# Patient Record
Sex: Female | Born: 1940 | Race: White | Hispanic: No | Marital: Married | Smoking: Never smoker
Health system: Southern US, Community
[De-identification: ages and names within clinical notes are randomized; demographics above are authoritative.]

## PROBLEM LIST (undated history)

## (undated) DIAGNOSIS — R609 Edema, unspecified: Secondary | ICD-10-CM

## (undated) DIAGNOSIS — I480 Paroxysmal atrial fibrillation: Secondary | ICD-10-CM

## (undated) DIAGNOSIS — G4730 Sleep apnea, unspecified: Secondary | ICD-10-CM

## (undated) DIAGNOSIS — I10 Essential (primary) hypertension: Secondary | ICD-10-CM

## (undated) DIAGNOSIS — E559 Vitamin D deficiency, unspecified: Secondary | ICD-10-CM

## (undated) DIAGNOSIS — E785 Hyperlipidemia, unspecified: Secondary | ICD-10-CM

## (undated) DIAGNOSIS — E782 Mixed hyperlipidemia: Secondary | ICD-10-CM

## (undated) DIAGNOSIS — I509 Heart failure, unspecified: Secondary | ICD-10-CM

## (undated) DIAGNOSIS — K219 Gastro-esophageal reflux disease without esophagitis: Secondary | ICD-10-CM

## (undated) HISTORY — PX: APPENDECTOMY: SHX54

## (undated) HISTORY — PX: STOMACH SURGERY: SHX791

## (undated) HISTORY — PX: TUBAL LIGATION: SHX77

---
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
## 2002-12-11 ENCOUNTER — Encounter: Payer: Self-pay | Admitting: Surgery

## 2002-12-16 ENCOUNTER — Inpatient Hospital Stay (HOSPITAL_COMMUNITY): Admission: RE | Admit: 2002-12-16 | Discharge: 2002-12-18 | Payer: Self-pay | Admitting: Surgery

## 2002-12-16 ENCOUNTER — Encounter: Payer: Self-pay | Admitting: Surgery

## 2002-12-17 ENCOUNTER — Encounter: Payer: Self-pay | Admitting: Surgery

## 2007-08-07 ENCOUNTER — Encounter: Admission: RE | Admit: 2007-08-07 | Discharge: 2007-08-07 | Payer: Self-pay | Admitting: Surgery

## 2007-10-12 ENCOUNTER — Inpatient Hospital Stay (HOSPITAL_COMMUNITY): Admission: RE | Admit: 2007-10-12 | Discharge: 2007-10-14 | Payer: Self-pay | Admitting: Surgery

## 2007-11-12 ENCOUNTER — Encounter: Admission: RE | Admit: 2007-11-12 | Discharge: 2007-11-12 | Payer: Self-pay | Admitting: *Deleted

## 2008-02-14 IMAGING — CR DG UGI W/ HIGH DENSITY W/KUB
19 of 24 series · 19 of 24 positions shown · non-contrast
Comparison: GI [REDACTED] upper GI series 08/07/07.

CLINICAL DATA: Choking/dysphagia, intermittent nausea, epigastric pain while eating.  Nissen fundoplication for hiatus hernia 10/11/07. 
 UPPER GI WITH HIGH DENSITY WITH KUB:
TECHNIQUE: Double and single contrast barium with ingested 13mm barium tablet with water.

[run (1 of 19)]
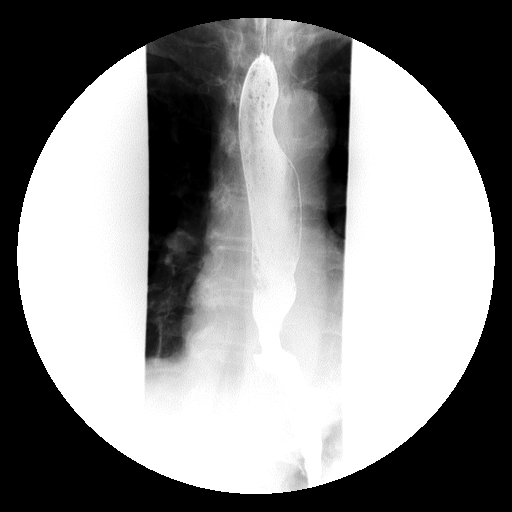

[run (2 of 19)]
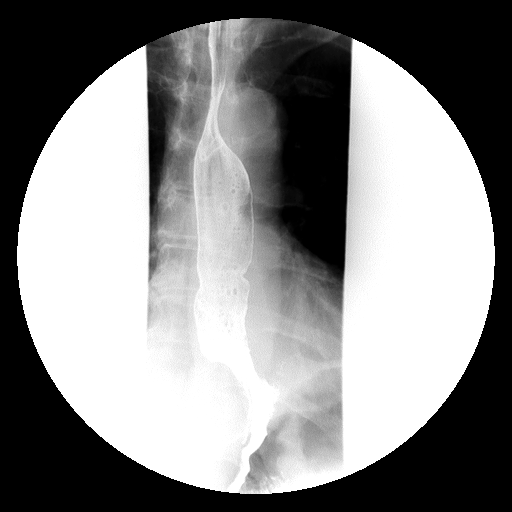

[run (3 of 19)]
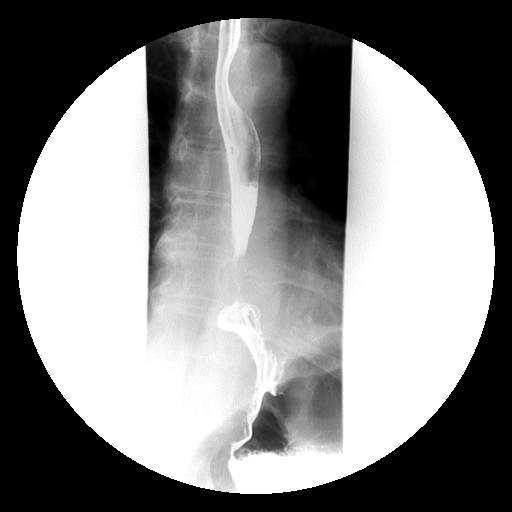

[run (4 of 19)]
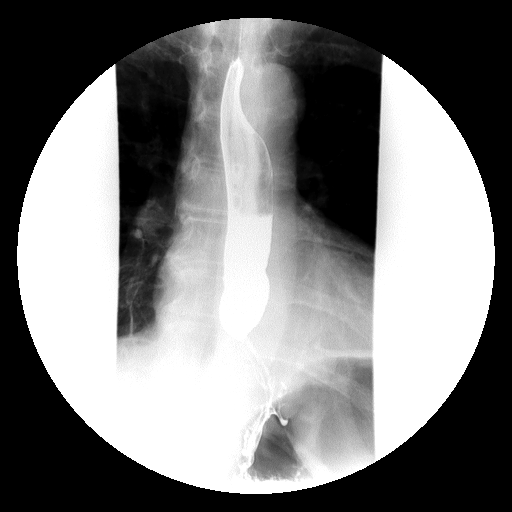

[run (5 of 19)]
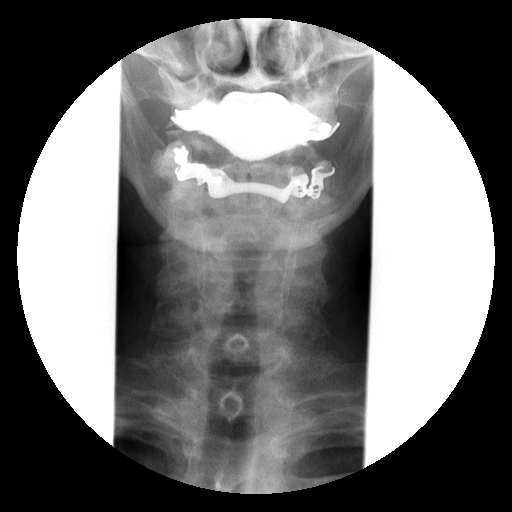

[run (6 of 19)]
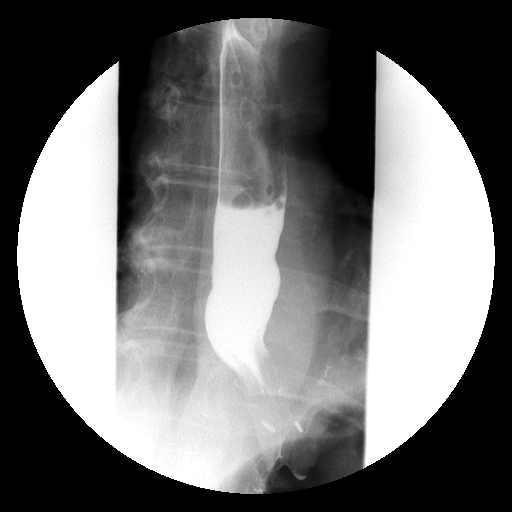

[run (7 of 19)]
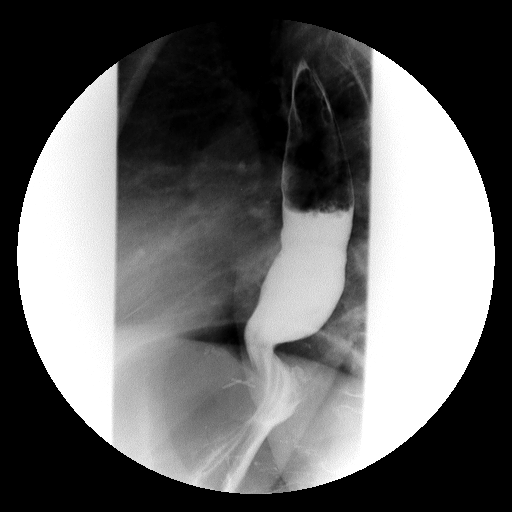

[run (8 of 19)]
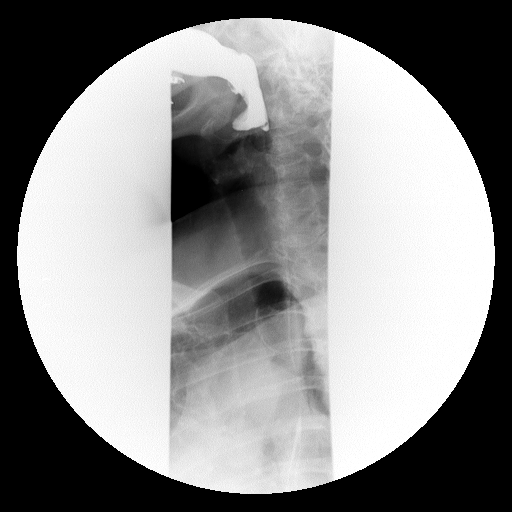

[run (9 of 19)]
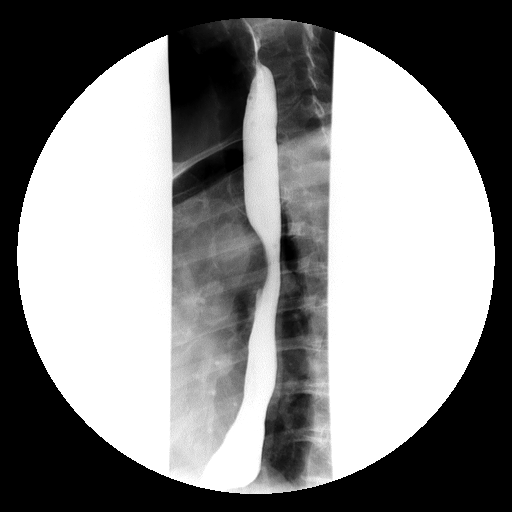

[run (10 of 19)]
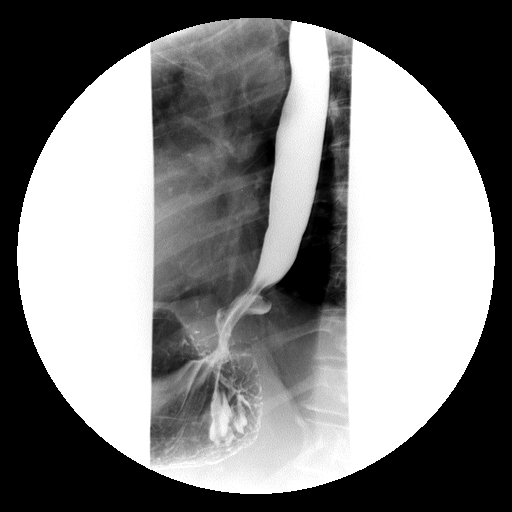

[run (11 of 19)]
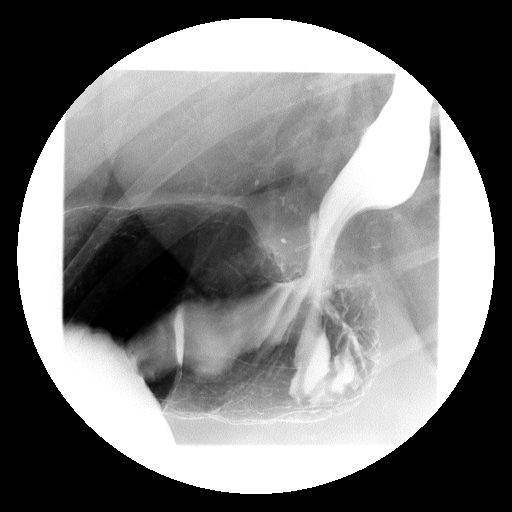

[run (12 of 19)]
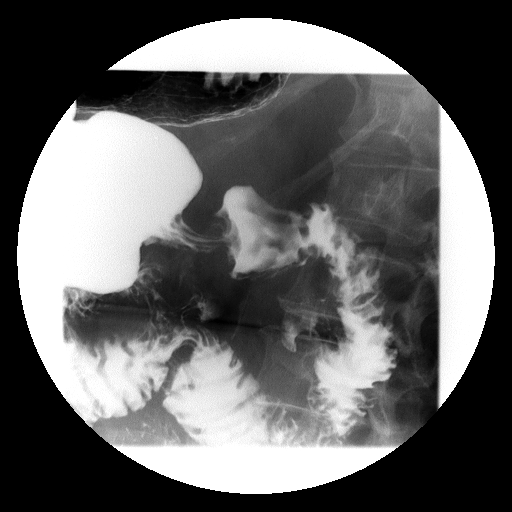

[run (13 of 19)]
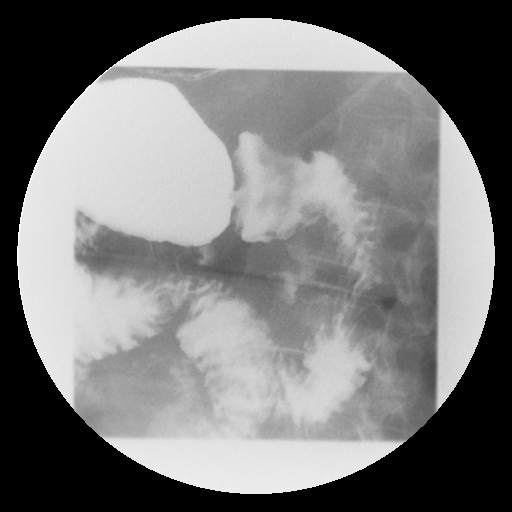

[run (14 of 19)]
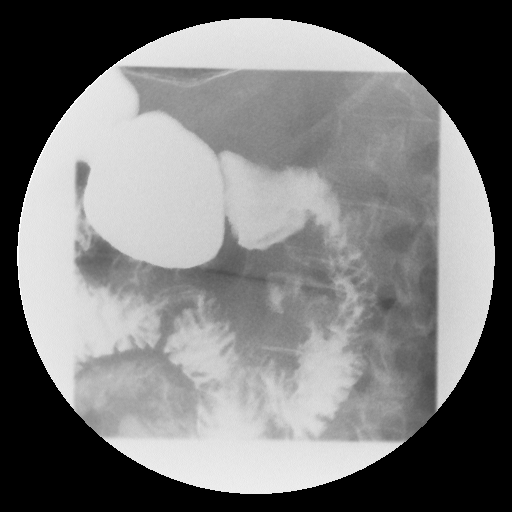

[run (15 of 19)]
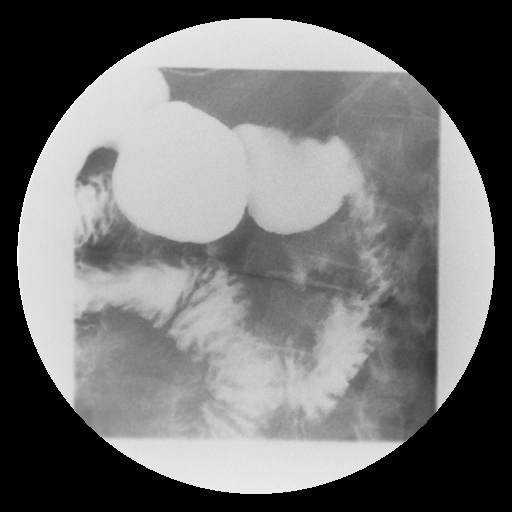

[run (16 of 19)]
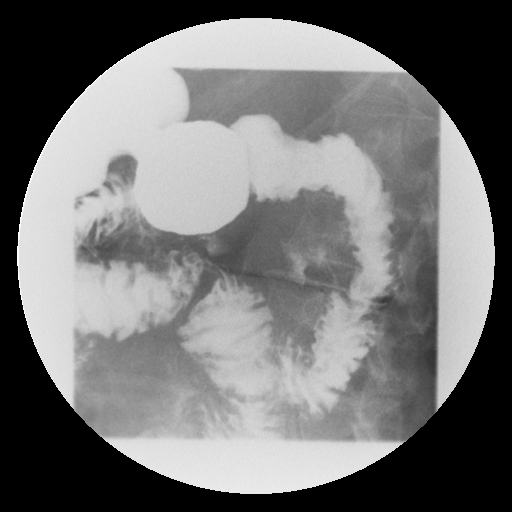

[run (17 of 19)]
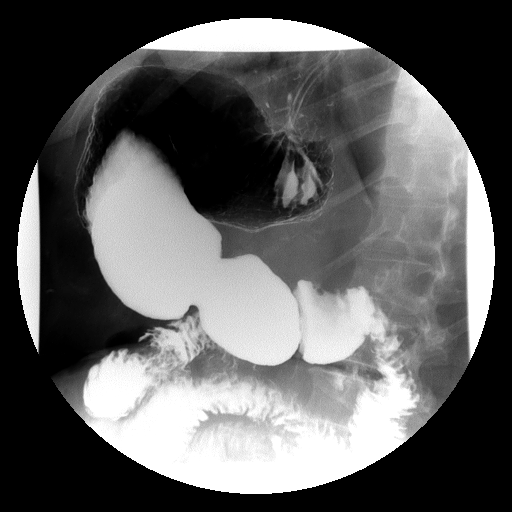

[run (18 of 19)]
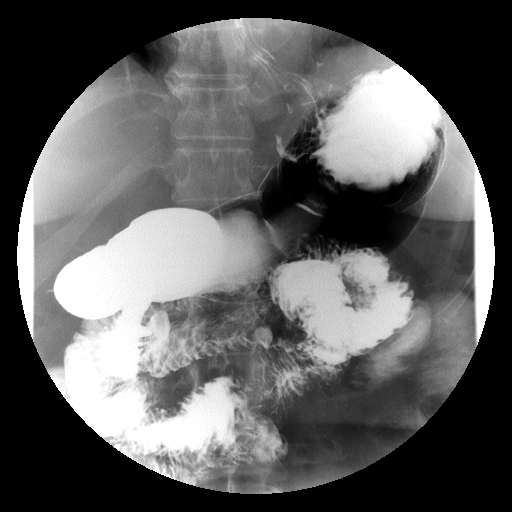

[run (19 of 19)]
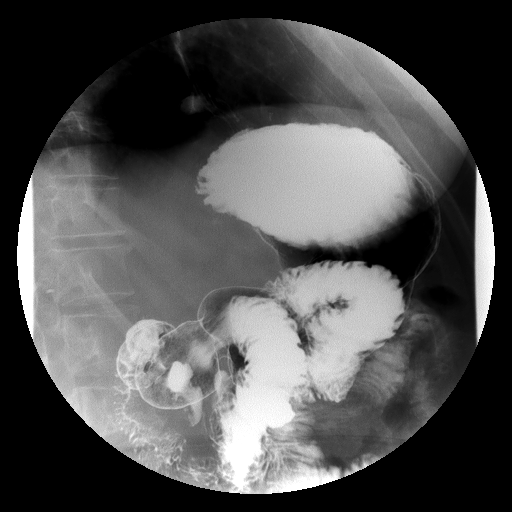

[19 of 24 positions shown; findings below may reference images not displayed]

FINDINGS: Since prior study interval satisfactory appearing Nissen/fundoplication with no persistent paraesophageal hernia seen.  Normal antegrade peristalsis is seen through the cervical and thoracic esophagus with no intrinsic nor extrinsic esophageal lesion.  No spontaneous nor water siphon/valsalva induced gastroesophageal reflux was currently demonstrated.  Ingested 13mm barium tablet readily passed into the stomach.  Remaining stomach, duodenum, and proximal loops of jejunum are unremarkable.
IMPRESSION: 1.  Satisfactory appearing interval Nissen/fundoplication without complicating findings. 
 2.  Otherwise negative.

## 2011-04-11 NOTE — Discharge Summary (Signed)
[name], [name]                ACCOUNT NO.:  [id number]   MEDICAL RECORD NO.:  [id number]          PATIENT TYPE:  INP   LOCATION:  1522                         FACILITY:  [facility]   PHYSICIAN:  Thornton Park. Daphine Deutscher, MD  DATE OF BIRTH:  1941/05/08   DATE OF ADMISSION:  10/11/2007  DATE OF DISCHARGE:  10/14/2007                               DISCHARGE SUMMARY   ADMITTING DIAGNOSIS:  Recurrent symptoms after previous Nissen.   PROCEDURE:  Status post laparoscopic takedown of herniated Nissen above  wrap in a recurrent paraesophageal hiatal hernia.   HOSPITAL COURSE:  Jill Strickland was an a.m. admission on November 14, and  underwent a laparoscopic takedown of her incarcerated hernia from her  chest.  Her Nissen, however, was intact and I left that in place.  I  repaired her diaphragm primarily with pledgeted sutures and then put an  onlay of MTF tissue.  Postoperatively, she had some nausea issues.  She  did have an NG tube down for overnight and this was removed and started  on lipids and advanced.  She was doing well at this time and will follow  up in the office in 3 weeks.   DISCHARGE DIAGNOSIS:  Status post laparoscopic repair of recurrent  hiatal hernia leaving the wrap intact.      Thornton Park Daphine Deutscher, MD  Electronically Signed     MBM/MEDQ  D:  10/14/2007  T:  10/15/2007  Job:  [id number]

## 2011-04-11 NOTE — Op Note (Signed)
Jill Strickland, Jill Strickland                ACCOUNT NO.:  [id number]   MEDICAL RECORD NO.:  [id number]          PATIENT TYPE:  AMB   LOCATION:  DAY                          FACILITY:  [facility]   PHYSICIAN:  Thornton Park. Daphine Deutscher, MD  DATE OF BIRTH:  [birth year]   DATE OF PROCEDURE:  10/11/2007  DATE OF DISCHARGE:                               OPERATIVE REPORT   PREOPERATIVE DIAGNOSIS:  Recurrent hiatal hernia with prolapse of the  wrap into the chest.   POSTOPERATIVE DIAGNOSIS:  Recurrent hiatal hernia with prolapse of the  wrap into the chest.   PROCEDURE:  Laparoscopic take down of herniated wrap into recurrent  giant hiatal hernia, upper endoscopy to verify anatomy by Dr. Colin Benton,  closure of hiatus with three posterior and one anterior pledgeted  sutures of [place], onlay of MTF human skin allograft sutured to the  hiatus with a #50 bougie in place with an NG tube, as well, in place,  inspection of wrap with tacking of the wrap over bougie with 2-0 Vicryl,  placement of Tisseel on esophagus junction with the human tissue.   SURGEON:  Thornton Park. Daphine Deutscher, M.D.   ASSISTANT:  Alfonse Ras, M.D.   OPERATIVE TIME:  3 1/2 hours.   ANESTHESIA:  General endotracheal.   ESTIMATED BLOOD LOSS:  Minimal.   DESCRIPTION OF PROCEDURE:  The patient was taken to room 1 and given  general anesthesia.  The abdomen was prepped with TechniCare and draped  sterilely.  Initially, I went through the left upper quadrant through an  old scar which I excised and the OptiVu did not pass into the abdomen to  my satisfaction.  I then went to another old site that she had where I  did a cutdown and placed a Hassan cannula.  I then inspected the site of  the other OptiVu and it had not entered the abdomen and I went ahead and  redirected it into the abdomen without difficulty and ultimately placed  a 5 on the left side and then another 10/11 up in the upper abdomen.  A  5 mm in the upper abdomen used to place a  Surveyor, mining.   Unlike most redo Nissen's, the stomach in this case, had herniated above  the diaphragm so that the wrap was not tacked to the liver.  I went up  and took it down off the edge of the diaphragm anteriorly and freed that  and was able to get everything out of this chest and stripped out a fair  amount of the sac, although it was a very large sac.  Once I had the  stomach in length, Dr. Colin Benton endoscoped her and we verified her anatomy.  We did have good esophageal length in the abdomen down to the wrap.  I  went ahead and then closed this posteriorly with three more sutures of  pledgeted material where I could see the other pledgets down  posteriorly, and then I put one anteriorly which I tied over a #50  bougie which we then inserted along with the NG tube which we had  left  in place.  With those in place, I then went ahead and cut a piece of MTF  mesh, thick, 4 x 7, and tacked it in the midline posterolaterally on  either side and then at the probably 2 and 10 o'clock positions on the  anterior diaphragm to secure it.  Tisseel was applied subsequently up  underneath that after I removed the lighted bougie.  While the bougie  was in place, I did plicate the wrapped stomach back to itself as the  wrap, itself, seemed to be intact and was left in place.  The patient  seemed to tolerate this procedure well.  We did not suspect any  enterotomies and we looked carefully and did not see any  bleeding or enterotomies.  We deflated the abdomen, closing first the  umbilical port under laparoscopic vision with a simple 0 Vicryl  interrupted and then deflated and closed the wounds with 4-0 Vicryl with  Dermabond.  The patient tolerated the procedure well and will go to the  step down unit postop.      Thornton Park Daphine Deutscher, MD  Electronically Signed     MBM/MEDQ  D:  10/11/2007  T:  10/12/2007  Job:  [id number]

## 2011-04-14 NOTE — Op Note (Signed)
NAMEYNEZ, EUGENIO                          ACCOUNT NO.:  [id number]   MEDICAL RECORD NO.:  [id number]                   PATIENT TYPE:  AMB   LOCATION:  DAY                                  FACILITY:  [facility]   PHYSICIAN:  Thornton Park. Daphine Deutscher, M.D.             DATE OF BIRTH:  November 26, 1941   DATE OF PROCEDURE:  12/16/2002  DATE OF DISCHARGE:                                 OPERATIVE REPORT   OFFICE MEDICAL RECORD NUMBER:  [id number].   PREOPERATIVE DIAGNOSIS:  Large type 3 mixed hiatal hernia.   POSTOPERATIVE DIAGNOSIS:  Large type 3 mixed hiatal hernia.   PROCEDURE:  Laparoscopic Nissen fundoplication and laparoscopic repair of  large hiatal hernia.   SURGEON:  Thornton Park. Daphine Deutscher, M.D.   ASSISTANT:  Vikki Ports, M.D.   ANESTHESIA:  General endotracheal.   OPERATIVE TIME:  Two hours.   DESCRIPTION OF PROCEDURE:  Dashley Monts is a 69 year old lady, who was taken  to OR 1 and given general anesthesia.  The abdomen was prepped with Betadine  and draped sterilely.  PAS hose were in place.  Following prepping with  Betadine and draping sterilely, I made a longitudinal incision above the  umbilicus, inserted a pursestring suture, and inserted a Hasson cannula.  The abdomen was insufflated.  Following insufflation, two ports were placed  on the right side, one on the left side, and a 5 mm in the upper midline.  The liver was retracted, and the stomach was seen to be way up in the chest.  This was grasped and brought down into the abdomen.  The patient had a  fairly dense sac.   I started by taking down the gastrohepatic membrane, exposing the right  crus.  I dissected around the right side of the esophagus and  retroesophageal and was able to see the left crus to the right side.  I then  carried this anteriorly as we reduced the stomach and took down the sac.  This sac that I was able to get down out of the chest and removed and  brought down onto the stomach.  I did have a  Penrose drain placed around the  distal esophagus as I pulled it into the abdomen.  We passed a #50 lighted  Bougie, and I was able to watch that go into the stomach, and that helped [place]  estimate and calibrate the closure of the hiatus.  The cardia and fundus  were freed up and had plenty of mobility in the stomach in that region.  I  then felt that I had good clearance and good mobility of the esophagus into  the abdomen.  I went ahead and closed the hiatus with three sutures  posteriorly.  The first two I also used pledgets.  The final suture was  closed at the esophagus, and at least three were tied down and completely  obliterated the hiatus.  I then went around and grasped stomach and brought it around and found a  contiguous portion of the stomach to wrap.  I then pulled the Bougie out and  then placed three sutures, the upper one being an extracorporeal tie and  followed by another one above that and a third below that.  This created a  good invagination of the distal esophagus, and it was sutured up on the  esophagus with all three purchases with esophageal bites as well as the two  gastric bites.  Clips were placed on the top two knots.  A good, healthy  wrap was present.  No bleeding was noted.  The liver retractor was  withdrawn.  No other abnormalities were noted.  The umbilical port was tied  down, and this closed this, and this was examined laparoscopically.  The  other ports were observed as I removed them and deflated the abdomen.  The  wounds were closed with 4-0 Vicryl and Benzoin and Steri-Strips.  The  patient seemed to tolerate the procedure well and was taken to the recovery  room in satisfactory condition.                                               Thornton Park Daphine Deutscher, M.D.    MBM/MEDQ  D:  12/16/2002  T:  12/16/2002  Job:  [id number]   cc:   [facility]  [address] [place]  [place]  [place] [address]  Fax: [phone]   Harl Bowie, M.D.  [address]  [place]  [place] [address]  Fax: [phone]

## 2011-09-05 LAB — CBC
HCT: 36.6
HCT: 38.8
Hemoglobin: 13
Hemoglobin: 13.6
MCHC: 35.1
MCHC: 35.5
MCV: 87.8
MCV: 88.1
Platelets: 287
Platelets: 312
RBC: 4.16
RBC: 4.42
RDW: 12.9
RDW: 12.9
WBC: 10.4
WBC: 9.6

## 2011-09-05 LAB — HEMOGLOBIN AND HEMATOCRIT, BLOOD: Hemoglobin: 14

## 2011-09-05 LAB — BASIC METABOLIC PANEL
BUN: 17
GFR calc Af Amer: >60
GFR calc non Af Amer: 59 — ABNORMAL LOW
Potassium: 4.4
Sodium: 145

## 2014-12-17 DIAGNOSIS — K210 Gastro-esophageal reflux disease with esophagitis: Secondary | ICD-10-CM | POA: Diagnosis not present

## 2014-12-17 DIAGNOSIS — J189 Pneumonia, unspecified organism: Secondary | ICD-10-CM | POA: Diagnosis not present

## 2014-12-17 DIAGNOSIS — Z6830 Body mass index (BMI) 30.0-30.9, adult: Secondary | ICD-10-CM | POA: Diagnosis not present

## 2014-12-17 DIAGNOSIS — I10 Essential (primary) hypertension: Secondary | ICD-10-CM | POA: Diagnosis not present

## 2014-12-17 DIAGNOSIS — J301 Allergic rhinitis due to pollen: Secondary | ICD-10-CM | POA: Diagnosis not present

## 2014-12-29 DIAGNOSIS — Z6830 Body mass index (BMI) 30.0-30.9, adult: Secondary | ICD-10-CM | POA: Diagnosis not present

## 2014-12-29 DIAGNOSIS — E559 Vitamin D deficiency, unspecified: Secondary | ICD-10-CM | POA: Diagnosis not present

## 2014-12-29 DIAGNOSIS — I10 Essential (primary) hypertension: Secondary | ICD-10-CM | POA: Diagnosis not present

## 2014-12-29 DIAGNOSIS — E785 Hyperlipidemia, unspecified: Secondary | ICD-10-CM | POA: Diagnosis not present

## 2015-04-13 DIAGNOSIS — E559 Vitamin D deficiency, unspecified: Secondary | ICD-10-CM | POA: Diagnosis not present

## 2015-04-13 DIAGNOSIS — I10 Essential (primary) hypertension: Secondary | ICD-10-CM | POA: Diagnosis not present

## 2015-04-13 DIAGNOSIS — Z6831 Body mass index (BMI) 31.0-31.9, adult: Secondary | ICD-10-CM | POA: Diagnosis not present

## 2015-04-13 DIAGNOSIS — K210 Gastro-esophageal reflux disease with esophagitis: Secondary | ICD-10-CM | POA: Diagnosis not present

## 2015-04-13 DIAGNOSIS — R609 Edema, unspecified: Secondary | ICD-10-CM | POA: Diagnosis not present

## 2015-04-13 DIAGNOSIS — E785 Hyperlipidemia, unspecified: Secondary | ICD-10-CM | POA: Diagnosis not present

## 2015-04-18 DIAGNOSIS — Z880 Allergy status to penicillin: Secondary | ICD-10-CM | POA: Diagnosis not present

## 2015-04-18 DIAGNOSIS — I4891 Unspecified atrial fibrillation: Secondary | ICD-10-CM | POA: Diagnosis not present

## 2015-04-18 DIAGNOSIS — Z79899 Other long term (current) drug therapy: Secondary | ICD-10-CM | POA: Diagnosis not present

## 2015-04-18 DIAGNOSIS — Z885 Allergy status to narcotic agent status: Secondary | ICD-10-CM | POA: Diagnosis not present

## 2015-04-18 DIAGNOSIS — R002 Palpitations: Secondary | ICD-10-CM | POA: Diagnosis not present

## 2015-04-18 DIAGNOSIS — K219 Gastro-esophageal reflux disease without esophagitis: Secondary | ICD-10-CM | POA: Diagnosis not present

## 2015-04-18 DIAGNOSIS — Z7982 Long term (current) use of aspirin: Secondary | ICD-10-CM | POA: Diagnosis not present

## 2015-04-20 DIAGNOSIS — I10 Essential (primary) hypertension: Secondary | ICD-10-CM | POA: Diagnosis not present

## 2015-04-20 DIAGNOSIS — R609 Edema, unspecified: Secondary | ICD-10-CM | POA: Diagnosis not present

## 2015-04-20 DIAGNOSIS — E785 Hyperlipidemia, unspecified: Secondary | ICD-10-CM | POA: Diagnosis not present

## 2015-04-20 DIAGNOSIS — K210 Gastro-esophageal reflux disease with esophagitis: Secondary | ICD-10-CM | POA: Diagnosis not present

## 2015-04-29 DIAGNOSIS — I480 Paroxysmal atrial fibrillation: Secondary | ICD-10-CM

## 2015-04-29 DIAGNOSIS — I10 Essential (primary) hypertension: Secondary | ICD-10-CM

## 2015-04-29 DIAGNOSIS — G4730 Sleep apnea, unspecified: Secondary | ICD-10-CM

## 2015-04-29 HISTORY — DX: Paroxysmal atrial fibrillation: I48.0

## 2015-04-29 HISTORY — DX: Sleep apnea, unspecified: G47.30

## 2015-04-29 HISTORY — DX: Essential (primary) hypertension: I10

## 2015-05-16 DIAGNOSIS — I480 Paroxysmal atrial fibrillation: Secondary | ICD-10-CM | POA: Diagnosis not present

## 2015-05-18 DIAGNOSIS — I340 Nonrheumatic mitral (valve) insufficiency: Secondary | ICD-10-CM | POA: Diagnosis not present

## 2015-05-25 DIAGNOSIS — Z1231 Encounter for screening mammogram for malignant neoplasm of breast: Secondary | ICD-10-CM | POA: Diagnosis not present

## 2015-06-15 DIAGNOSIS — I10 Essential (primary) hypertension: Secondary | ICD-10-CM | POA: Diagnosis not present

## 2015-06-15 DIAGNOSIS — G4730 Sleep apnea, unspecified: Secondary | ICD-10-CM | POA: Diagnosis not present

## 2015-06-15 DIAGNOSIS — I480 Paroxysmal atrial fibrillation: Secondary | ICD-10-CM | POA: Diagnosis not present

## 2015-07-20 DIAGNOSIS — I10 Essential (primary) hypertension: Secondary | ICD-10-CM | POA: Diagnosis not present

## 2015-07-20 DIAGNOSIS — R609 Edema, unspecified: Secondary | ICD-10-CM | POA: Diagnosis not present

## 2015-07-20 DIAGNOSIS — I4891 Unspecified atrial fibrillation: Secondary | ICD-10-CM | POA: Diagnosis not present

## 2015-07-20 DIAGNOSIS — K210 Gastro-esophageal reflux disease with esophagitis: Secondary | ICD-10-CM | POA: Diagnosis not present

## 2015-07-20 DIAGNOSIS — Z1389 Encounter for screening for other disorder: Secondary | ICD-10-CM | POA: Diagnosis not present

## 2015-07-20 DIAGNOSIS — Z139 Encounter for screening, unspecified: Secondary | ICD-10-CM | POA: Diagnosis not present

## 2015-07-20 DIAGNOSIS — E785 Hyperlipidemia, unspecified: Secondary | ICD-10-CM | POA: Diagnosis not present

## 2015-09-07 DIAGNOSIS — I480 Paroxysmal atrial fibrillation: Secondary | ICD-10-CM | POA: Diagnosis not present

## 2015-09-07 DIAGNOSIS — I10 Essential (primary) hypertension: Secondary | ICD-10-CM | POA: Diagnosis not present

## 2015-09-07 DIAGNOSIS — G4730 Sleep apnea, unspecified: Secondary | ICD-10-CM | POA: Diagnosis not present

## 2015-12-08 DIAGNOSIS — E785 Hyperlipidemia, unspecified: Secondary | ICD-10-CM | POA: Diagnosis not present

## 2015-12-15 DIAGNOSIS — E785 Hyperlipidemia, unspecified: Secondary | ICD-10-CM | POA: Diagnosis not present

## 2015-12-15 DIAGNOSIS — K210 Gastro-esophageal reflux disease with esophagitis: Secondary | ICD-10-CM | POA: Diagnosis not present

## 2015-12-15 DIAGNOSIS — I4891 Unspecified atrial fibrillation: Secondary | ICD-10-CM | POA: Diagnosis not present

## 2015-12-15 DIAGNOSIS — Z6831 Body mass index (BMI) 31.0-31.9, adult: Secondary | ICD-10-CM | POA: Diagnosis not present

## 2015-12-15 DIAGNOSIS — I10 Essential (primary) hypertension: Secondary | ICD-10-CM | POA: Diagnosis not present

## 2015-12-15 DIAGNOSIS — I509 Heart failure, unspecified: Secondary | ICD-10-CM | POA: Diagnosis not present

## 2015-12-15 DIAGNOSIS — K5792 Diverticulitis of intestine, part unspecified, without perforation or abscess without bleeding: Secondary | ICD-10-CM | POA: Diagnosis not present

## 2015-12-15 DIAGNOSIS — R300 Dysuria: Secondary | ICD-10-CM | POA: Diagnosis not present

## 2016-02-02 DIAGNOSIS — R319 Hematuria, unspecified: Secondary | ICD-10-CM | POA: Diagnosis not present

## 2016-02-02 DIAGNOSIS — N950 Postmenopausal bleeding: Secondary | ICD-10-CM | POA: Diagnosis not present

## 2016-02-08 DIAGNOSIS — N950 Postmenopausal bleeding: Secondary | ICD-10-CM | POA: Diagnosis not present

## 2016-02-08 DIAGNOSIS — R938 Abnormal findings on diagnostic imaging of other specified body structures: Secondary | ICD-10-CM | POA: Diagnosis not present

## 2016-02-09 DIAGNOSIS — N841 Polyp of cervix uteri: Secondary | ICD-10-CM | POA: Diagnosis not present

## 2016-02-09 DIAGNOSIS — M818 Other osteoporosis without current pathological fracture: Secondary | ICD-10-CM

## 2016-02-09 DIAGNOSIS — I509 Heart failure, unspecified: Secondary | ICD-10-CM

## 2016-02-09 DIAGNOSIS — E559 Vitamin D deficiency, unspecified: Secondary | ICD-10-CM

## 2016-02-09 DIAGNOSIS — N950 Postmenopausal bleeding: Secondary | ICD-10-CM | POA: Diagnosis not present

## 2016-02-09 DIAGNOSIS — R609 Edema, unspecified: Secondary | ICD-10-CM

## 2016-02-09 DIAGNOSIS — E785 Hyperlipidemia, unspecified: Secondary | ICD-10-CM

## 2016-02-09 HISTORY — DX: Vitamin D deficiency, unspecified: E55.9

## 2016-02-09 HISTORY — DX: Hyperlipidemia, unspecified: E78.5

## 2016-02-09 HISTORY — DX: Heart failure, unspecified: I50.9

## 2016-02-09 HISTORY — DX: Edema, unspecified: R60.9

## 2016-02-09 HISTORY — DX: Other osteoporosis without current pathological fracture: M81.8

## 2016-02-23 DIAGNOSIS — N950 Postmenopausal bleeding: Secondary | ICD-10-CM | POA: Diagnosis not present

## 2016-03-01 DIAGNOSIS — N840 Polyp of corpus uteri: Secondary | ICD-10-CM | POA: Diagnosis not present

## 2016-03-01 DIAGNOSIS — R938 Abnormal findings on diagnostic imaging of other specified body structures: Secondary | ICD-10-CM | POA: Diagnosis not present

## 2016-03-01 DIAGNOSIS — N950 Postmenopausal bleeding: Secondary | ICD-10-CM | POA: Diagnosis not present

## 2016-03-15 DIAGNOSIS — N840 Polyp of corpus uteri: Secondary | ICD-10-CM | POA: Diagnosis not present

## 2016-03-15 DIAGNOSIS — N950 Postmenopausal bleeding: Secondary | ICD-10-CM | POA: Diagnosis not present

## 2016-03-29 DIAGNOSIS — E785 Hyperlipidemia, unspecified: Secondary | ICD-10-CM | POA: Diagnosis not present

## 2016-03-29 DIAGNOSIS — I4891 Unspecified atrial fibrillation: Secondary | ICD-10-CM | POA: Diagnosis not present

## 2016-03-29 DIAGNOSIS — K210 Gastro-esophageal reflux disease with esophagitis: Secondary | ICD-10-CM | POA: Diagnosis not present

## 2016-03-29 DIAGNOSIS — E669 Obesity, unspecified: Secondary | ICD-10-CM | POA: Diagnosis not present

## 2016-03-29 DIAGNOSIS — E559 Vitamin D deficiency, unspecified: Secondary | ICD-10-CM | POA: Diagnosis not present

## 2016-03-29 DIAGNOSIS — R42 Dizziness and giddiness: Secondary | ICD-10-CM | POA: Diagnosis not present

## 2016-03-29 DIAGNOSIS — I509 Heart failure, unspecified: Secondary | ICD-10-CM | POA: Diagnosis not present

## 2016-03-29 DIAGNOSIS — I10 Essential (primary) hypertension: Secondary | ICD-10-CM | POA: Diagnosis not present

## 2016-04-04 DIAGNOSIS — Z01810 Encounter for preprocedural cardiovascular examination: Secondary | ICD-10-CM | POA: Diagnosis not present

## 2016-04-04 DIAGNOSIS — G4730 Sleep apnea, unspecified: Secondary | ICD-10-CM | POA: Diagnosis not present

## 2016-04-04 DIAGNOSIS — I480 Paroxysmal atrial fibrillation: Secondary | ICD-10-CM | POA: Diagnosis not present

## 2016-04-04 DIAGNOSIS — I10 Essential (primary) hypertension: Secondary | ICD-10-CM | POA: Diagnosis not present

## 2016-04-14 DIAGNOSIS — Z01818 Encounter for other preprocedural examination: Secondary | ICD-10-CM | POA: Diagnosis not present

## 2016-04-14 DIAGNOSIS — R079 Chest pain, unspecified: Secondary | ICD-10-CM | POA: Diagnosis not present

## 2016-04-14 DIAGNOSIS — I4891 Unspecified atrial fibrillation: Secondary | ICD-10-CM | POA: Diagnosis not present

## 2016-04-14 DIAGNOSIS — N950 Postmenopausal bleeding: Secondary | ICD-10-CM | POA: Diagnosis not present

## 2016-04-14 DIAGNOSIS — N840 Polyp of corpus uteri: Secondary | ICD-10-CM | POA: Diagnosis not present

## 2016-04-18 DIAGNOSIS — D259 Leiomyoma of uterus, unspecified: Secondary | ICD-10-CM | POA: Diagnosis not present

## 2016-04-18 DIAGNOSIS — N840 Polyp of corpus uteri: Secondary | ICD-10-CM | POA: Diagnosis not present

## 2016-04-18 DIAGNOSIS — Z79899 Other long term (current) drug therapy: Secondary | ICD-10-CM | POA: Diagnosis not present

## 2016-04-18 DIAGNOSIS — I509 Heart failure, unspecified: Secondary | ICD-10-CM | POA: Diagnosis not present

## 2016-04-18 DIAGNOSIS — I4891 Unspecified atrial fibrillation: Secondary | ICD-10-CM | POA: Diagnosis not present

## 2016-04-18 DIAGNOSIS — E785 Hyperlipidemia, unspecified: Secondary | ICD-10-CM | POA: Diagnosis not present

## 2016-04-18 DIAGNOSIS — K219 Gastro-esophageal reflux disease without esophagitis: Secondary | ICD-10-CM | POA: Diagnosis not present

## 2016-04-18 DIAGNOSIS — M818 Other osteoporosis without current pathological fracture: Secondary | ICD-10-CM | POA: Diagnosis not present

## 2016-04-18 DIAGNOSIS — I480 Paroxysmal atrial fibrillation: Secondary | ICD-10-CM | POA: Diagnosis not present

## 2016-04-18 DIAGNOSIS — I110 Hypertensive heart disease with heart failure: Secondary | ICD-10-CM | POA: Diagnosis not present

## 2016-04-18 DIAGNOSIS — N950 Postmenopausal bleeding: Secondary | ICD-10-CM | POA: Diagnosis not present

## 2016-05-03 DIAGNOSIS — I4891 Unspecified atrial fibrillation: Secondary | ICD-10-CM | POA: Diagnosis not present

## 2016-05-03 DIAGNOSIS — I509 Heart failure, unspecified: Secondary | ICD-10-CM | POA: Diagnosis not present

## 2016-05-03 DIAGNOSIS — Z6830 Body mass index (BMI) 30.0-30.9, adult: Secondary | ICD-10-CM | POA: Diagnosis not present

## 2016-05-03 DIAGNOSIS — I10 Essential (primary) hypertension: Secondary | ICD-10-CM | POA: Diagnosis not present

## 2016-06-07 DIAGNOSIS — Z6831 Body mass index (BMI) 31.0-31.9, adult: Secondary | ICD-10-CM | POA: Diagnosis not present

## 2016-06-07 DIAGNOSIS — I4891 Unspecified atrial fibrillation: Secondary | ICD-10-CM | POA: Diagnosis not present

## 2016-06-07 DIAGNOSIS — E785 Hyperlipidemia, unspecified: Secondary | ICD-10-CM | POA: Diagnosis not present

## 2016-06-07 DIAGNOSIS — J301 Allergic rhinitis due to pollen: Secondary | ICD-10-CM | POA: Diagnosis not present

## 2016-06-07 DIAGNOSIS — I10 Essential (primary) hypertension: Secondary | ICD-10-CM | POA: Diagnosis not present

## 2016-06-07 DIAGNOSIS — J019 Acute sinusitis, unspecified: Secondary | ICD-10-CM | POA: Diagnosis not present

## 2016-07-12 DIAGNOSIS — E559 Vitamin D deficiency, unspecified: Secondary | ICD-10-CM | POA: Diagnosis not present

## 2016-07-12 DIAGNOSIS — Z9181 History of falling: Secondary | ICD-10-CM | POA: Diagnosis not present

## 2016-07-12 DIAGNOSIS — K210 Gastro-esophageal reflux disease with esophagitis: Secondary | ICD-10-CM | POA: Diagnosis not present

## 2016-07-12 DIAGNOSIS — E785 Hyperlipidemia, unspecified: Secondary | ICD-10-CM | POA: Diagnosis not present

## 2016-07-12 DIAGNOSIS — Z1231 Encounter for screening mammogram for malignant neoplasm of breast: Secondary | ICD-10-CM | POA: Diagnosis not present

## 2016-07-12 DIAGNOSIS — Z6830 Body mass index (BMI) 30.0-30.9, adult: Secondary | ICD-10-CM | POA: Diagnosis not present

## 2016-07-12 DIAGNOSIS — I4891 Unspecified atrial fibrillation: Secondary | ICD-10-CM | POA: Diagnosis not present

## 2016-07-12 DIAGNOSIS — I10 Essential (primary) hypertension: Secondary | ICD-10-CM | POA: Diagnosis not present

## 2016-07-19 DIAGNOSIS — E785 Hyperlipidemia, unspecified: Secondary | ICD-10-CM | POA: Diagnosis not present

## 2016-07-19 DIAGNOSIS — I119 Hypertensive heart disease without heart failure: Secondary | ICD-10-CM | POA: Diagnosis not present

## 2016-07-19 DIAGNOSIS — Z1211 Encounter for screening for malignant neoplasm of colon: Secondary | ICD-10-CM | POA: Diagnosis not present

## 2016-07-19 DIAGNOSIS — Z1389 Encounter for screening for other disorder: Secondary | ICD-10-CM | POA: Diagnosis not present

## 2016-07-19 DIAGNOSIS — I480 Paroxysmal atrial fibrillation: Secondary | ICD-10-CM | POA: Diagnosis not present

## 2016-07-19 DIAGNOSIS — Z789 Other specified health status: Secondary | ICD-10-CM | POA: Diagnosis not present

## 2016-07-19 DIAGNOSIS — E8881 Metabolic syndrome: Secondary | ICD-10-CM | POA: Diagnosis not present

## 2016-07-19 DIAGNOSIS — Z23 Encounter for immunization: Secondary | ICD-10-CM | POA: Diagnosis not present

## 2016-07-19 DIAGNOSIS — Z0000 Encounter for general adult medical examination without abnormal findings: Secondary | ICD-10-CM | POA: Diagnosis not present

## 2016-07-27 DIAGNOSIS — Z1231 Encounter for screening mammogram for malignant neoplasm of breast: Secondary | ICD-10-CM | POA: Diagnosis not present

## 2016-09-05 DIAGNOSIS — I890 Lymphedema, not elsewhere classified: Secondary | ICD-10-CM | POA: Diagnosis not present

## 2016-09-05 DIAGNOSIS — Z789 Other specified health status: Secondary | ICD-10-CM | POA: Diagnosis not present

## 2016-09-05 DIAGNOSIS — Z0000 Encounter for general adult medical examination without abnormal findings: Secondary | ICD-10-CM | POA: Diagnosis not present

## 2016-09-05 DIAGNOSIS — K219 Gastro-esophageal reflux disease without esophagitis: Secondary | ICD-10-CM | POA: Diagnosis not present

## 2016-09-05 DIAGNOSIS — I480 Paroxysmal atrial fibrillation: Secondary | ICD-10-CM | POA: Diagnosis not present

## 2016-09-05 DIAGNOSIS — I119 Hypertensive heart disease without heart failure: Secondary | ICD-10-CM | POA: Diagnosis not present

## 2016-09-05 DIAGNOSIS — E7800 Pure hypercholesterolemia, unspecified: Secondary | ICD-10-CM | POA: Diagnosis not present

## 2016-09-05 DIAGNOSIS — E8881 Metabolic syndrome: Secondary | ICD-10-CM | POA: Diagnosis not present

## 2016-11-01 DIAGNOSIS — Z79899 Other long term (current) drug therapy: Secondary | ICD-10-CM | POA: Diagnosis not present

## 2016-11-01 DIAGNOSIS — Z5181 Encounter for therapeutic drug level monitoring: Secondary | ICD-10-CM | POA: Diagnosis not present

## 2016-11-01 DIAGNOSIS — E785 Hyperlipidemia, unspecified: Secondary | ICD-10-CM | POA: Diagnosis not present

## 2016-11-15 DIAGNOSIS — I890 Lymphedema, not elsewhere classified: Secondary | ICD-10-CM | POA: Diagnosis not present

## 2016-11-15 DIAGNOSIS — Z0000 Encounter for general adult medical examination without abnormal findings: Secondary | ICD-10-CM | POA: Diagnosis not present

## 2016-11-28 DIAGNOSIS — J208 Acute bronchitis due to other specified organisms: Secondary | ICD-10-CM | POA: Diagnosis not present

## 2016-11-28 DIAGNOSIS — I119 Hypertensive heart disease without heart failure: Secondary | ICD-10-CM | POA: Diagnosis not present

## 2016-11-28 DIAGNOSIS — N3001 Acute cystitis with hematuria: Secondary | ICD-10-CM | POA: Diagnosis not present

## 2016-11-28 DIAGNOSIS — B9689 Other specified bacterial agents as the cause of diseases classified elsewhere: Secondary | ICD-10-CM | POA: Diagnosis not present

## 2016-11-28 DIAGNOSIS — K219 Gastro-esophageal reflux disease without esophagitis: Secondary | ICD-10-CM | POA: Diagnosis not present

## 2016-12-18 DIAGNOSIS — Z01810 Encounter for preprocedural cardiovascular examination: Secondary | ICD-10-CM | POA: Diagnosis not present

## 2016-12-18 DIAGNOSIS — I480 Paroxysmal atrial fibrillation: Secondary | ICD-10-CM | POA: Diagnosis not present

## 2016-12-18 DIAGNOSIS — I10 Essential (primary) hypertension: Secondary | ICD-10-CM | POA: Diagnosis not present

## 2016-12-18 DIAGNOSIS — G4730 Sleep apnea, unspecified: Secondary | ICD-10-CM | POA: Diagnosis not present

## 2017-02-15 DIAGNOSIS — M9903 Segmental and somatic dysfunction of lumbar region: Secondary | ICD-10-CM | POA: Diagnosis not present

## 2017-02-15 DIAGNOSIS — M5137 Other intervertebral disc degeneration, lumbosacral region: Secondary | ICD-10-CM | POA: Diagnosis not present

## 2017-02-15 DIAGNOSIS — M5442 Lumbago with sciatica, left side: Secondary | ICD-10-CM | POA: Diagnosis not present

## 2017-02-15 DIAGNOSIS — M5136 Other intervertebral disc degeneration, lumbar region: Secondary | ICD-10-CM | POA: Diagnosis not present

## 2017-02-15 DIAGNOSIS — M9904 Segmental and somatic dysfunction of sacral region: Secondary | ICD-10-CM | POA: Diagnosis not present

## 2017-02-19 DIAGNOSIS — M9904 Segmental and somatic dysfunction of sacral region: Secondary | ICD-10-CM | POA: Diagnosis not present

## 2017-02-19 DIAGNOSIS — M9903 Segmental and somatic dysfunction of lumbar region: Secondary | ICD-10-CM | POA: Diagnosis not present

## 2017-02-19 DIAGNOSIS — M5442 Lumbago with sciatica, left side: Secondary | ICD-10-CM | POA: Diagnosis not present

## 2017-02-19 DIAGNOSIS — M5137 Other intervertebral disc degeneration, lumbosacral region: Secondary | ICD-10-CM | POA: Diagnosis not present

## 2017-02-19 DIAGNOSIS — M5136 Other intervertebral disc degeneration, lumbar region: Secondary | ICD-10-CM | POA: Diagnosis not present

## 2017-02-20 DIAGNOSIS — M9903 Segmental and somatic dysfunction of lumbar region: Secondary | ICD-10-CM | POA: Diagnosis not present

## 2017-02-20 DIAGNOSIS — M9904 Segmental and somatic dysfunction of sacral region: Secondary | ICD-10-CM | POA: Diagnosis not present

## 2017-02-20 DIAGNOSIS — M5136 Other intervertebral disc degeneration, lumbar region: Secondary | ICD-10-CM | POA: Diagnosis not present

## 2017-02-20 DIAGNOSIS — M5442 Lumbago with sciatica, left side: Secondary | ICD-10-CM | POA: Diagnosis not present

## 2017-02-20 DIAGNOSIS — M5137 Other intervertebral disc degeneration, lumbosacral region: Secondary | ICD-10-CM | POA: Diagnosis not present

## 2017-02-21 DIAGNOSIS — M9903 Segmental and somatic dysfunction of lumbar region: Secondary | ICD-10-CM | POA: Diagnosis not present

## 2017-02-21 DIAGNOSIS — M9904 Segmental and somatic dysfunction of sacral region: Secondary | ICD-10-CM | POA: Diagnosis not present

## 2017-02-21 DIAGNOSIS — M5136 Other intervertebral disc degeneration, lumbar region: Secondary | ICD-10-CM | POA: Diagnosis not present

## 2017-02-21 DIAGNOSIS — M5442 Lumbago with sciatica, left side: Secondary | ICD-10-CM | POA: Diagnosis not present

## 2017-02-21 DIAGNOSIS — M5137 Other intervertebral disc degeneration, lumbosacral region: Secondary | ICD-10-CM | POA: Diagnosis not present

## 2017-02-26 DIAGNOSIS — M9903 Segmental and somatic dysfunction of lumbar region: Secondary | ICD-10-CM | POA: Diagnosis not present

## 2017-02-26 DIAGNOSIS — M5442 Lumbago with sciatica, left side: Secondary | ICD-10-CM | POA: Diagnosis not present

## 2017-02-26 DIAGNOSIS — M5137 Other intervertebral disc degeneration, lumbosacral region: Secondary | ICD-10-CM | POA: Diagnosis not present

## 2017-02-26 DIAGNOSIS — M5136 Other intervertebral disc degeneration, lumbar region: Secondary | ICD-10-CM | POA: Diagnosis not present

## 2017-02-26 DIAGNOSIS — M9904 Segmental and somatic dysfunction of sacral region: Secondary | ICD-10-CM | POA: Diagnosis not present

## 2017-02-27 DIAGNOSIS — M5137 Other intervertebral disc degeneration, lumbosacral region: Secondary | ICD-10-CM | POA: Diagnosis not present

## 2017-02-27 DIAGNOSIS — M9904 Segmental and somatic dysfunction of sacral region: Secondary | ICD-10-CM | POA: Diagnosis not present

## 2017-02-27 DIAGNOSIS — M5442 Lumbago with sciatica, left side: Secondary | ICD-10-CM | POA: Diagnosis not present

## 2017-02-27 DIAGNOSIS — M5136 Other intervertebral disc degeneration, lumbar region: Secondary | ICD-10-CM | POA: Diagnosis not present

## 2017-02-27 DIAGNOSIS — M9903 Segmental and somatic dysfunction of lumbar region: Secondary | ICD-10-CM | POA: Diagnosis not present

## 2017-03-05 DIAGNOSIS — M5136 Other intervertebral disc degeneration, lumbar region: Secondary | ICD-10-CM | POA: Diagnosis not present

## 2017-03-05 DIAGNOSIS — M9903 Segmental and somatic dysfunction of lumbar region: Secondary | ICD-10-CM | POA: Diagnosis not present

## 2017-03-05 DIAGNOSIS — M5442 Lumbago with sciatica, left side: Secondary | ICD-10-CM | POA: Diagnosis not present

## 2017-03-05 DIAGNOSIS — M9904 Segmental and somatic dysfunction of sacral region: Secondary | ICD-10-CM | POA: Diagnosis not present

## 2017-03-05 DIAGNOSIS — M5137 Other intervertebral disc degeneration, lumbosacral region: Secondary | ICD-10-CM | POA: Diagnosis not present

## 2017-03-06 DIAGNOSIS — M5442 Lumbago with sciatica, left side: Secondary | ICD-10-CM | POA: Diagnosis not present

## 2017-03-06 DIAGNOSIS — M5136 Other intervertebral disc degeneration, lumbar region: Secondary | ICD-10-CM | POA: Diagnosis not present

## 2017-03-06 DIAGNOSIS — M9903 Segmental and somatic dysfunction of lumbar region: Secondary | ICD-10-CM | POA: Diagnosis not present

## 2017-03-06 DIAGNOSIS — M9904 Segmental and somatic dysfunction of sacral region: Secondary | ICD-10-CM | POA: Diagnosis not present

## 2017-03-06 DIAGNOSIS — M5137 Other intervertebral disc degeneration, lumbosacral region: Secondary | ICD-10-CM | POA: Diagnosis not present

## 2017-03-08 DIAGNOSIS — M5442 Lumbago with sciatica, left side: Secondary | ICD-10-CM | POA: Diagnosis not present

## 2017-03-08 DIAGNOSIS — M9904 Segmental and somatic dysfunction of sacral region: Secondary | ICD-10-CM | POA: Diagnosis not present

## 2017-03-08 DIAGNOSIS — M5137 Other intervertebral disc degeneration, lumbosacral region: Secondary | ICD-10-CM | POA: Diagnosis not present

## 2017-03-08 DIAGNOSIS — M5136 Other intervertebral disc degeneration, lumbar region: Secondary | ICD-10-CM | POA: Diagnosis not present

## 2017-03-08 DIAGNOSIS — M9903 Segmental and somatic dysfunction of lumbar region: Secondary | ICD-10-CM | POA: Diagnosis not present

## 2017-03-12 DIAGNOSIS — Z79899 Other long term (current) drug therapy: Secondary | ICD-10-CM | POA: Diagnosis not present

## 2017-03-12 DIAGNOSIS — M9904 Segmental and somatic dysfunction of sacral region: Secondary | ICD-10-CM | POA: Diagnosis not present

## 2017-03-12 DIAGNOSIS — Z6833 Body mass index (BMI) 33.0-33.9, adult: Secondary | ICD-10-CM | POA: Diagnosis not present

## 2017-03-12 DIAGNOSIS — K219 Gastro-esophageal reflux disease without esophagitis: Secondary | ICD-10-CM | POA: Diagnosis not present

## 2017-03-12 DIAGNOSIS — I480 Paroxysmal atrial fibrillation: Secondary | ICD-10-CM | POA: Diagnosis not present

## 2017-03-12 DIAGNOSIS — M5136 Other intervertebral disc degeneration, lumbar region: Secondary | ICD-10-CM | POA: Diagnosis not present

## 2017-03-12 DIAGNOSIS — Z789 Other specified health status: Secondary | ICD-10-CM | POA: Diagnosis not present

## 2017-03-12 DIAGNOSIS — E8881 Metabolic syndrome: Secondary | ICD-10-CM | POA: Diagnosis not present

## 2017-03-12 DIAGNOSIS — M5137 Other intervertebral disc degeneration, lumbosacral region: Secondary | ICD-10-CM | POA: Diagnosis not present

## 2017-03-12 DIAGNOSIS — M9903 Segmental and somatic dysfunction of lumbar region: Secondary | ICD-10-CM | POA: Diagnosis not present

## 2017-03-12 DIAGNOSIS — M5442 Lumbago with sciatica, left side: Secondary | ICD-10-CM | POA: Diagnosis not present

## 2017-03-12 DIAGNOSIS — I119 Hypertensive heart disease without heart failure: Secondary | ICD-10-CM | POA: Diagnosis not present

## 2017-03-12 DIAGNOSIS — E785 Hyperlipidemia, unspecified: Secondary | ICD-10-CM | POA: Diagnosis not present

## 2017-03-13 DIAGNOSIS — M5442 Lumbago with sciatica, left side: Secondary | ICD-10-CM | POA: Diagnosis not present

## 2017-03-13 DIAGNOSIS — M9903 Segmental and somatic dysfunction of lumbar region: Secondary | ICD-10-CM | POA: Diagnosis not present

## 2017-03-13 DIAGNOSIS — M5137 Other intervertebral disc degeneration, lumbosacral region: Secondary | ICD-10-CM | POA: Diagnosis not present

## 2017-03-13 DIAGNOSIS — M5136 Other intervertebral disc degeneration, lumbar region: Secondary | ICD-10-CM | POA: Diagnosis not present

## 2017-03-13 DIAGNOSIS — M9904 Segmental and somatic dysfunction of sacral region: Secondary | ICD-10-CM | POA: Diagnosis not present

## 2017-03-14 DIAGNOSIS — M9904 Segmental and somatic dysfunction of sacral region: Secondary | ICD-10-CM | POA: Diagnosis not present

## 2017-03-14 DIAGNOSIS — M5137 Other intervertebral disc degeneration, lumbosacral region: Secondary | ICD-10-CM | POA: Diagnosis not present

## 2017-03-14 DIAGNOSIS — M5136 Other intervertebral disc degeneration, lumbar region: Secondary | ICD-10-CM | POA: Diagnosis not present

## 2017-03-14 DIAGNOSIS — M5442 Lumbago with sciatica, left side: Secondary | ICD-10-CM | POA: Diagnosis not present

## 2017-03-14 DIAGNOSIS — M9903 Segmental and somatic dysfunction of lumbar region: Secondary | ICD-10-CM | POA: Diagnosis not present

## 2017-03-20 DIAGNOSIS — M9904 Segmental and somatic dysfunction of sacral region: Secondary | ICD-10-CM | POA: Diagnosis not present

## 2017-03-20 DIAGNOSIS — M5136 Other intervertebral disc degeneration, lumbar region: Secondary | ICD-10-CM | POA: Diagnosis not present

## 2017-03-20 DIAGNOSIS — M5137 Other intervertebral disc degeneration, lumbosacral region: Secondary | ICD-10-CM | POA: Diagnosis not present

## 2017-03-20 DIAGNOSIS — M9903 Segmental and somatic dysfunction of lumbar region: Secondary | ICD-10-CM | POA: Diagnosis not present

## 2017-03-20 DIAGNOSIS — M5442 Lumbago with sciatica, left side: Secondary | ICD-10-CM | POA: Diagnosis not present

## 2017-03-21 DIAGNOSIS — M5442 Lumbago with sciatica, left side: Secondary | ICD-10-CM | POA: Diagnosis not present

## 2017-03-21 DIAGNOSIS — M5137 Other intervertebral disc degeneration, lumbosacral region: Secondary | ICD-10-CM | POA: Diagnosis not present

## 2017-03-21 DIAGNOSIS — M9903 Segmental and somatic dysfunction of lumbar region: Secondary | ICD-10-CM | POA: Diagnosis not present

## 2017-03-21 DIAGNOSIS — M9904 Segmental and somatic dysfunction of sacral region: Secondary | ICD-10-CM | POA: Diagnosis not present

## 2017-03-21 DIAGNOSIS — M5136 Other intervertebral disc degeneration, lumbar region: Secondary | ICD-10-CM | POA: Diagnosis not present

## 2017-03-22 DIAGNOSIS — M5136 Other intervertebral disc degeneration, lumbar region: Secondary | ICD-10-CM | POA: Diagnosis not present

## 2017-03-22 DIAGNOSIS — M9904 Segmental and somatic dysfunction of sacral region: Secondary | ICD-10-CM | POA: Diagnosis not present

## 2017-03-22 DIAGNOSIS — M5442 Lumbago with sciatica, left side: Secondary | ICD-10-CM | POA: Diagnosis not present

## 2017-03-22 DIAGNOSIS — M9903 Segmental and somatic dysfunction of lumbar region: Secondary | ICD-10-CM | POA: Diagnosis not present

## 2017-03-22 DIAGNOSIS — M5137 Other intervertebral disc degeneration, lumbosacral region: Secondary | ICD-10-CM | POA: Diagnosis not present

## 2017-04-11 DIAGNOSIS — M9904 Segmental and somatic dysfunction of sacral region: Secondary | ICD-10-CM | POA: Diagnosis not present

## 2017-04-11 DIAGNOSIS — M5136 Other intervertebral disc degeneration, lumbar region: Secondary | ICD-10-CM | POA: Diagnosis not present

## 2017-04-11 DIAGNOSIS — M5442 Lumbago with sciatica, left side: Secondary | ICD-10-CM | POA: Diagnosis not present

## 2017-04-11 DIAGNOSIS — M9903 Segmental and somatic dysfunction of lumbar region: Secondary | ICD-10-CM | POA: Diagnosis not present

## 2017-04-11 DIAGNOSIS — M5137 Other intervertebral disc degeneration, lumbosacral region: Secondary | ICD-10-CM | POA: Diagnosis not present

## 2017-05-17 DIAGNOSIS — M5442 Lumbago with sciatica, left side: Secondary | ICD-10-CM | POA: Diagnosis not present

## 2017-05-17 DIAGNOSIS — M5136 Other intervertebral disc degeneration, lumbar region: Secondary | ICD-10-CM | POA: Diagnosis not present

## 2017-05-17 DIAGNOSIS — M5137 Other intervertebral disc degeneration, lumbosacral region: Secondary | ICD-10-CM | POA: Diagnosis not present

## 2017-05-17 DIAGNOSIS — M9904 Segmental and somatic dysfunction of sacral region: Secondary | ICD-10-CM | POA: Diagnosis not present

## 2017-05-17 DIAGNOSIS — M9903 Segmental and somatic dysfunction of lumbar region: Secondary | ICD-10-CM | POA: Diagnosis not present

## 2017-09-05 DIAGNOSIS — E785 Hyperlipidemia, unspecified: Secondary | ICD-10-CM | POA: Diagnosis not present

## 2017-09-05 DIAGNOSIS — E8881 Metabolic syndrome: Secondary | ICD-10-CM | POA: Diagnosis not present

## 2017-09-05 DIAGNOSIS — Z79899 Other long term (current) drug therapy: Secondary | ICD-10-CM | POA: Diagnosis not present

## 2017-09-10 DIAGNOSIS — Z0000 Encounter for general adult medical examination without abnormal findings: Secondary | ICD-10-CM | POA: Diagnosis not present

## 2017-09-10 DIAGNOSIS — E8881 Metabolic syndrome: Secondary | ICD-10-CM | POA: Diagnosis not present

## 2017-09-10 DIAGNOSIS — Z23 Encounter for immunization: Secondary | ICD-10-CM | POA: Diagnosis not present

## 2017-09-10 DIAGNOSIS — I119 Hypertensive heart disease without heart failure: Secondary | ICD-10-CM | POA: Diagnosis not present

## 2017-09-10 DIAGNOSIS — Z9181 History of falling: Secondary | ICD-10-CM | POA: Diagnosis not present

## 2017-09-10 DIAGNOSIS — E785 Hyperlipidemia, unspecified: Secondary | ICD-10-CM | POA: Diagnosis not present

## 2017-09-10 DIAGNOSIS — E7800 Pure hypercholesterolemia, unspecified: Secondary | ICD-10-CM | POA: Diagnosis not present

## 2017-09-10 DIAGNOSIS — K219 Gastro-esophageal reflux disease without esophagitis: Secondary | ICD-10-CM | POA: Diagnosis not present

## 2017-09-10 DIAGNOSIS — I480 Paroxysmal atrial fibrillation: Secondary | ICD-10-CM | POA: Diagnosis not present

## 2017-09-10 DIAGNOSIS — I890 Lymphedema, not elsewhere classified: Secondary | ICD-10-CM | POA: Diagnosis not present

## 2017-09-28 ENCOUNTER — Other Ambulatory Visit: Payer: Self-pay | Admitting: Pharmacist

## 2017-09-28 NOTE — Patient Outreach (Signed)
Call to follow up with patient's PCP, Dr. Cathi Roan, to request that he update the patient's losartan prescription with her pharmacy to reflect the new dosing that he has instructed the patient to follow, losartan 25 mg - 1/2 tablet by mouth once daily. Leave a message with the provider's nurse. If have not heard back by next week, will follow up again at that time.  Harlow Asa, PharmD, [place] [facility] [phone]

## 2017-09-28 NOTE — Patient Outreach (Signed)
Incoming call from Doris Cheadle in response to the [facility] Medication Adherence Campaign. Speak with patient. HIPAA identifiers verified and verbal consent received.  Ms. Weyandt reports that she takes her losartan and furosemide once daily as directed and metoprolol twice daily as directed. Patient reports that she rarely misses morning doses of her medications. Patient states that she does sometimes miss her bedtime dose of medication because she falls asleep before taking it. Counsel patient on the importance of medication adherence. Counsel patient to take her evening dose of metoprolol earlier to avoid missing this dose. Patient verbalizes understanding and states that she will make this change. Patient reports that her PCP recently advised her to decrease her losartan dose from losartan 25 mg once daily to 12.5 mg once daily, cutting her tablets in half. Ms. Schleicher reports that her PCP, Dr. Cathi Roan, has not yet given her a new prescription to reflect this change in dosing.  Patient denies any further medication questions/concerns at this time.  PLAN  Will call to follow up with patient's PCP, Dr. Cathi Roan, to request that he update the patient's losartan prescription with her pharmacy to reflect the new dosing that he has instructed the patient to follow, losartan 25 mg - 1/2 tablet by mouth once daily.  Harlow Asa, PharmD, [facility] [phone]

## 2017-10-05 ENCOUNTER — Other Ambulatory Visit: Payer: Self-pay | Admitting: Pharmacist

## 2017-10-05 NOTE — Patient Outreach (Signed)
Call to follow up with patient's PCP, Dr. Cathi Roan, to request that he update the patient's losartan prescription with her pharmacy to reflect the new dosing that he has instructed the patient to follow, losartan 25 mg - 1/2 tablet by mouth once daily. Leave another message on the voicemail of Jerene Pitch, nurse in the provider's office. If have not heard back by next week, will follow up again at that time.  Harlow Asa, PharmD, [place] [facility] [phone]

## 2017-10-08 ENCOUNTER — Other Ambulatory Visit: Payer: Self-pay | Admitting: Pharmacist

## 2017-10-08 NOTE — Patient Outreach (Addendum)
Receive a call back from [facility] in Dr. Leonette Most office. Jerene Pitch reports that she will reach out to the patient regarding her current losartan 25 mg - 1/2 tablet by mouth once daily dosing and verify the dose with Dr. Cathi Roan. Reports that if the dose is to continue as the patient reports that she has been taking it, Jerene Pitch will request that Dr. Cathi Roan send a new prescription into the patient's pharmacy to reflect this dosing. Jerene Pitch reports that she will give me a call back. If I have not heard back from [facility] by next week, will follow up again at that time.  Harlow Asa, PharmD, [place] [facility] [phone]

## 2017-10-17 ENCOUNTER — Other Ambulatory Visit: Payer: Self-pay | Admitting: Pharmacist

## 2017-10-17 NOTE — Patient Outreach (Signed)
Receive a call back from [facility] in Dr. Leonette Most office. Jerene Pitch reports that she has followed up with the patient and the provider and that the updated losartan prescription has been sent into Ms. Mazer's pharmacy.  Will close pharmacy episode at this time.  Harlow Asa, PharmD, [place] [facility] [phone]

## 2017-10-29 DIAGNOSIS — E7800 Pure hypercholesterolemia, unspecified: Secondary | ICD-10-CM | POA: Diagnosis not present

## 2017-10-29 DIAGNOSIS — I119 Hypertensive heart disease without heart failure: Secondary | ICD-10-CM | POA: Diagnosis not present

## 2017-10-29 DIAGNOSIS — Z6834 Body mass index (BMI) 34.0-34.9, adult: Secondary | ICD-10-CM | POA: Diagnosis not present

## 2017-10-29 DIAGNOSIS — J189 Pneumonia, unspecified organism: Secondary | ICD-10-CM | POA: Diagnosis not present

## 2018-02-25 DIAGNOSIS — Z6833 Body mass index (BMI) 33.0-33.9, adult: Secondary | ICD-10-CM | POA: Diagnosis not present

## 2018-02-25 DIAGNOSIS — J209 Acute bronchitis, unspecified: Secondary | ICD-10-CM | POA: Diagnosis not present

## 2018-03-05 DIAGNOSIS — K210 Gastro-esophageal reflux disease with esophagitis: Secondary | ICD-10-CM | POA: Diagnosis not present

## 2018-03-05 DIAGNOSIS — I110 Hypertensive heart disease with heart failure: Secondary | ICD-10-CM | POA: Diagnosis not present

## 2018-03-05 DIAGNOSIS — Z79899 Other long term (current) drug therapy: Secondary | ICD-10-CM | POA: Diagnosis not present

## 2018-03-05 DIAGNOSIS — I509 Heart failure, unspecified: Secondary | ICD-10-CM | POA: Diagnosis not present

## 2018-03-05 DIAGNOSIS — R5381 Other malaise: Secondary | ICD-10-CM | POA: Diagnosis not present

## 2018-03-05 DIAGNOSIS — R5383 Other fatigue: Secondary | ICD-10-CM | POA: Diagnosis not present

## 2018-03-05 DIAGNOSIS — E782 Mixed hyperlipidemia: Secondary | ICD-10-CM | POA: Diagnosis not present

## 2018-03-05 DIAGNOSIS — E559 Vitamin D deficiency, unspecified: Secondary | ICD-10-CM | POA: Diagnosis not present

## 2018-03-05 DIAGNOSIS — I10 Essential (primary) hypertension: Secondary | ICD-10-CM | POA: Diagnosis not present

## 2018-03-05 DIAGNOSIS — I480 Paroxysmal atrial fibrillation: Secondary | ICD-10-CM | POA: Diagnosis not present

## 2018-09-04 DIAGNOSIS — E782 Mixed hyperlipidemia: Secondary | ICD-10-CM

## 2018-09-04 DIAGNOSIS — K219 Gastro-esophageal reflux disease without esophagitis: Secondary | ICD-10-CM

## 2018-09-04 HISTORY — DX: Mixed hyperlipidemia: E78.2

## 2018-09-04 HISTORY — DX: Gastro-esophageal reflux disease without esophagitis: K21.9

## 2019-09-09 ENCOUNTER — Ambulatory Visit: Payer: Self-pay | Admitting: Cardiology

## 2019-09-10 ENCOUNTER — Other Ambulatory Visit: Payer: Self-pay

## 2019-09-10 ENCOUNTER — Ambulatory Visit (INDEPENDENT_AMBULATORY_CARE_PROVIDER_SITE_OTHER): Payer: Medicare HMO | Admitting: Cardiology

## 2019-09-10 ENCOUNTER — Encounter: Payer: Self-pay | Admitting: Cardiology

## 2019-09-10 VITALS — BP 132/86 | HR 69 | Ht 63.0 in | Wt 185.4 lb

## 2019-09-10 DIAGNOSIS — I10 Essential (primary) hypertension: Secondary | ICD-10-CM

## 2019-09-10 DIAGNOSIS — I480 Paroxysmal atrial fibrillation: Secondary | ICD-10-CM | POA: Diagnosis not present

## 2019-09-10 DIAGNOSIS — E782 Mixed hyperlipidemia: Secondary | ICD-10-CM

## 2019-09-10 DIAGNOSIS — R600 Localized edema: Secondary | ICD-10-CM

## 2019-09-10 DIAGNOSIS — Z1329 Encounter for screening for other suspected endocrine disorder: Secondary | ICD-10-CM

## 2019-09-10 HISTORY — DX: Localized edema: R60.0

## 2019-09-10 NOTE — Progress Notes (Signed)
Cardiology Office Note:    Date:  09/10/2019   ID:  Jill Strickland, DOB 1941/09/04, MRN [id number]  PCP:  Jill Broker, MD  Cardiologist:  Jill Lindau, MD   Referring MD: No ref. provider found    ASSESSMENT:    1. Paroxysmal atrial fibrillation (HCC)   2. Essential hypertension   3. Mixed dyslipidemia    PLAN:    In order of problems listed above:  1. Pedal edema: Left greater than right: We will do a DVT study especially because she is not taking her anticoagulation the right way. 2. Paroxysmal atrial fibrillation:I discussed with the patient atrial fibrillation, disease process. Management and therapy including rate and rhythm control, anticoagulation benefits and potential risks were discussed extensively with the patient. Patient had multiple questions which were answered to patient's satisfaction.  I cautioned the patient about taking medications appropriately and she promises to do so.  She will be on Eliquis 5 mg twice daily. 3. Essential hypertension: Blood pressure stable 4. Mixed dyslipidemia and obesity: Diet was discussed and weight reduction was stressed.  She promises to comply.  She will be seen in follow-up appointment in 4 months or earlier if she has any concerns she will come here 1 week before that for fasting blood work including Chem-7 liver lipid profile and TSH.   Medication Adjustments/Labs and Tests Ordered: Current medicines are reviewed at length with the patient today.  Concerns regarding medicines are outlined above.  No orders of the defined types were placed in this encounter.  No orders of the defined types were placed in this encounter.    No chief complaint on file.    History of Present Illness:    Jill Strickland is a 78 y.o. female.  This patient has been under my care in my previous practice.  He is here now to transfer his care and be established with my current practice.  Patient has past medical history of atrial  fibrillation and is on anticoagulation.  She tells me that she sometimes skips her doses because she wants to save on her medication.  She has history of essential hypertension.  She denies any problems at this time and takes care of activities of daily living.  She leads a sedentary lifestyle.  She has gained weight.  At the time of my evaluation, the patient is alert awake oriented and in no distress.  She mentions to me that she has bilateral pedal edema and she has told that by her primary doctor and he wanted me to evaluate this.  Past Medical History:  Diagnosis Date  . CHF (congestive heart failure) ([place]) 02/09/2016  . Edema 02/09/2016  . Essential hypertension 04/29/2015  . GERD without esophagitis 09/04/2018  . Hyperlipemia 02/09/2016  . Mixed dyslipidemia 09/04/2018  . Paroxysmal atrial fibrillation ([place]) 04/29/2015   Last Assessment & Plan:  First documented episode of atrial fibrillation lasting only for a few hours.she ended up going to the hospital. Converted spontaneously. Her chads score is 2. Hypertension and possibly congestive heart failure. She needs to be fully anticoagulated. Had a long discussion with her about that issue. I spoke with him explaining to her what's atrial fibrillation as was the rea  . Sleep apnea 04/29/2015   Overview:  SUSPECTED  . Vitamin D deficiency 02/09/2016    Past Surgical History:  Procedure Laterality Date  . APPENDECTOMY    . STOMACH SURGERY    . TUBAL LIGATION  Current Medications: Current Meds  Medication Sig  . apixaban (ELIQUIS) 5 MG TABS tablet Take 5 mg by mouth 2 (two) times daily.  . furosemide (LASIX) 40 MG tablet Take 40 mg by mouth daily.  Marland Kitchen glucosamine-chondroitin 500-400 MG tablet Take 1 tablet by mouth 3 (three) times daily.  . metoprolol tartrate (LOPRESSOR) 50 MG tablet Take 50 mg by mouth 2 (two) times daily.  Marland Kitchen omeprazole (PRILOSEC) 40 MG capsule Take 40 mg by mouth daily.  . potassium chloride (KLOR-CON) 10 MEQ tablet Take  10 mEq by mouth 2 (two) times daily.  . rosuvastatin (CRESTOR) 20 MG tablet Take 20 mg by mouth daily.     Allergies:   Morphine, Penicillins, and Cefuroxime axetil   Social History   Socioeconomic History  . Marital status: Married    Spouse name: Not on file  . Number of children: Not on file  . Years of education: Not on file  . Highest education level: Not on file  Occupational History  . Not on file  Social Needs  . Financial resource strain: Not on file  . Food insecurity    Worry: Not on file    Inability: Not on file  . Transportation needs    Medical: Not on file    Non-medical: Not on file  Tobacco Use  . Smoking status: Never Smoker  . Smokeless tobacco: Never Used  Substance and Sexual Activity  . Alcohol use: Never    Frequency: Never  . Drug use: Never  . Sexual activity: Not on file  Lifestyle  . Physical activity    Days per week: Not on file    Minutes per session: Not on file  . Stress: Not on file  Relationships  . Social Herbalist on phone: Not on file    Gets together: Not on file    Attends religious service: Not on file    Active member of club or organization: Not on file    Attends meetings of clubs or organizations: Not on file    Relationship status: Not on file  Other Topics Concern  . Not on file  Social History Narrative  . Not on file     Family History: The patient's family history includes Heart attack in her brother and father; Heart disease in her mother; Hyperlipidemia in her sister; Hypertension in her mother and sister; Osteoporosis in her mother; Thyroid disease in her mother.  ROS:   Please see the history of present illness.    All other systems reviewed and are negative.  EKGs/Labs/Other Studies Reviewed:    The following studies were reviewed today: EKG reveals sinus rhythm and nonspecific ST-T changes.  I reviewed lab work done by primary care physician.   Recent Labs: No results found for  requested labs within last 8760 hours.  Recent Lipid Panel No results found for: CHOL, TRIG, HDL, CHOLHDL, VLDL, LDLCALC, LDLDIRECT  Physical Exam:    VS:  BP 132/86 (BP Location: Left Arm, Patient Position: Sitting, Cuff Size: Normal)   Pulse 69   Ht 5\' 3"  (1.6 m)   Wt 185 lb 6.4 oz (84.1 kg)   SpO2 97%   BMI 32.84 kg/m     Wt Readings from Last 3 Encounters:  09/10/19 185 lb 6.4 oz (84.1 kg)     GEN: Patient is in no acute distress HEENT: Normal NECK: No JVD; No carotid bruits LYMPHATICS: No lymphadenopathy CARDIAC: Hear sounds regular, 2/6 systolic murmur at  the apex. RESPIRATORY:  Clear to auscultation without rales, wheezing or rhonchi  ABDOMEN: Soft, non-tender, non-distended MUSCULOSKELETAL:  No edema; No deformity  SKIN: Warm and dry NEUROLOGIC:  Alert and oriented x 3 PSYCHIATRIC:  Normal affect   Signed, Jill Lindau, MD  09/10/2019 2:20 PM    [facility]

## 2019-09-10 NOTE — Patient Instructions (Addendum)
Medication Instructions:  Your physician recommends that you continue on your current medications as directed. Please refer to the Current Medication list given to you today.  If you need a refill on your cardiac medications before your next appointment, please call your pharmacy.   Lab work: You will come back 1 week prior to your f/u for a lipid, hepatic, cbc, TSH and BMP to be drawn.  If you have labs (blood work) drawn today and your tests are completely normal, you will receive your results only by: Marland Kitchen MyChart Message (if you have MyChart) OR . A paper copy in the mail If you have any lab test that is abnormal or we need to change your treatment, we will call you to review the results.  Testing/Procedures: You had an EKG performed today.  Your physician has requested that you have an echocardiogram. Echocardiography is a painless test that uses sound waves to create images of your heart. It provides your doctor with information about the size and shape of your heart and how well your heart's chambers and valves are working. This procedure takes approximately one hour. There are no restrictions for this procedure.  Your physician has requested that you have a DVT study performed    Follow-Up: At [facility], you and your health needs are our priority.  As part of our continuing mission to provide you with exceptional heart care, we have created designated Provider Care Teams.  These Care Teams include your primary Cardiologist (physician) and Advanced Practice Providers (APPs -  Physician Assistants and Nurse Practitioners) who all work together to provide you with the care you need, when you need it. You will need a follow up appointment in 4 months.   Any Other Special Instructions Will Be Listed Below   How to Use Compression Stockings Compression stockings are elastic socks that squeeze the legs. They help increase blood flow (circulation) to the legs, decrease swelling in the  legs, and reduce the chance of developing blood clots in the lower legs. Compression stockings are often used by people who:  Are recovering from surgery.  Have poor circulation in their legs.  Tend to get blood clots in their legs.  Have bulging (varicose) veins.  Sit or stay in bed for long periods of time. Follow instructions from your health care provider about how and when to wear your compression stockings. How to wear compression stockings Before you put on your compression stockings:  Make sure that they are the correct size and degree of compression. If you do not know your size or required grade of compression, ask your health care provider and follow the manufacturer's instructions that come with the stockings.  Make sure that they are clean, dry, and in good condition.  Check them for rips and tears. Do not put them on if they are ripped or torn. Put your stockings on first thing in the morning, before you get out of bed. Keep them on for as long as your health care provider advises. When you are wearing your stockings:  Keep them as smooth as possible. Do not allow them to bunch up. It is especially important to prevent the stockings from bunching up around your toes or behind your knees.  Do not roll the stockings downward and leave them rolled down. This can decrease blood flow to your leg.  Change them right away if they become wet or dirty. When you take off your stockings, inspect your legs and feet. Check for:  Open sores.  Red spots.  Swelling. General tips  Do not stop wearing compression stockings without talking to your health care provider first.  Wash your stockings every day with mild detergent in cold or warm water. Do not use bleach. Air-dry your stockings or dry them in a clothes dryer on low heat. It may be helpful to have two pairs so that you have a pair to wear while the other is being washed.  Replace your stockings every 3-6 months.  If  skin moisturizing is part of your treatment plan, apply lotion or cream at night so that your skin will be dry when you put on the stockings in the morning. It is harder to put the stockings on when you have lotion on your legs or feet.  Wear nonskid shoes or slip-resistant socks when walking while wearing compression stockings. Contact a health care provider and remove your stockings if you have:  A feeling of pins and needles in your feet or legs.  Open sores, red spots, or other skin changes on your feet or legs.  Swelling or pain that gets worse. Get help right away if you have:  Numbness or tingling in your lower legs that does not get better right after you take the stockings off.  Toes or feet that are unusually cold or turn a bluish color.  A warm or red area on your leg.  New swelling or soreness in your leg.  Shortness of breath.  Chest pain.  A fast or irregular heartbeat.  Light-headedness.  Dizziness. Summary  Compression stockings are elastic socks that squeeze the legs.  They help increase blood flow (circulation) to the legs, decrease swelling in the legs, and reduce the chance of developing blood clots in the lower legs.  Follow instructions from your health care provider about how and when to wear your compression stockings.  Do not stop wearing your compression stockings without talking to your health care provider first. This information is not intended to replace advice given to you by your health care provider. Make sure you discuss any questions you have with your health care provider. Document Released: 09/10/2009 Document Revised: 11/15/2017 Document Reviewed: 11/15/2017 Elsevier Patient Education  2020 [place].  Deep Vein Thrombosis  Deep vein thrombosis (DVT) is a condition in which a blood clot forms in a deep vein, such as a lower leg, thigh, or arm vein. A clot is blood that has thickened into a gel or solid. This condition is dangerous.  It can lead to serious and even life-threatening complications if the clot travels to the lungs and causes a blockage (pulmonary embolism). It can also damage veins in the leg. This can result in leg pain, swelling, discoloration, and sores (post-thrombotic syndrome). What are the causes? This condition may be caused by:  A slowdown of blood flow.  Damage to a vein.  A condition that causes blood to clot more easily, such as an inherited clotting disorder. What increases the risk? The following factors may make you more likely to develop this condition:  Being overweight.  Being older, especially over age 60.  Sitting or lying down for more than four hours.  Being in the hospital.  Lack of physical activity (sedentary lifestyle).  Pregnancy, being in childbirth, or having recently given birth.  Taking medicines that contain estrogen, such as medicines to prevent pregnancy.  Smoking.  A history of any of the following: ? Blood clots or a blood clotting disease. ? Peripheral vascular  disease. ? Inflammatory bowel disease. ? Cancer. ? Heart disease. ? Genetic conditions that affect how your blood clots, such as Factor V Leiden mutation. ? Neurological diseases that affect your legs (leg paresis). ? A recent injury, such as a car accident. ? Major or lengthy surgery. ? A central line placed inside a large vein. What are the signs or symptoms? Symptoms of this condition include:  Swelling, pain, or tenderness in an arm or leg.  Warmth, redness, or discoloration in an arm or leg. If the clot is in your leg, symptoms may be more noticeable or worse when you stand or walk. Some people may not develop any symptoms. How is this diagnosed? This condition is diagnosed with:  A medical history and physical exam.  Tests, such as: ? Blood tests. These are done to check how well your blood clots. ? Ultrasound. This is done to check for clots. ? Venogram. For this test, contrast  dye is injected into a vein and X-rays are taken to check for any clots. How is this treated? Treatment for this condition depends on:  The cause of your DVT.  Your risk for bleeding or developing more clots.  Any other medical conditions that you have. Treatment may include:  Taking a blood thinner (anticoagulant). This type of medicine prevents clots from forming. It may be taken by mouth, injected under the skin, or injected through an IV (catheter).  Injecting clot-dissolving medicines into the affected vein (catheter-directed thrombolysis).  Having surgery. Surgery may be done to: ? Remove the clot. ? Place a filter in a large vein to catch blood clots before they reach the lungs. Some treatments may be continued for up to six months. Follow these instructions at home: If you are taking blood thinners:  Take the medicine exactly as told by your health care provider. Some blood thinners need to be taken at the same time every day. Do not skip a dose.  Talk with your health care provider before you take any medicines that contain aspirin or NSAIDs. These medicines increase your risk for dangerous bleeding.  Ask your health care provider about foods and drugs that could change the way the medicine works (may interact). Avoid those things if your health care provider tells you to do so.  Blood thinners can cause easy bruising and may make it difficult to stop bleeding. Because of this: ? Be very careful when using knives, scissors, or other sharp objects. ? Use an electric razor instead of a blade. ? Avoid activities that could cause injury or bruising, and follow instructions about how to prevent falls.  Wear a medical alert bracelet or carry a card that lists what medicines you take. General instructions  Take over-the-counter and prescription medicines only as told by your health care provider.  Return to your normal activities as told by your health care provider. Ask your  health care provider what activities are safe for you.  Wear compression stockings if recommended by your health care provider.  Keep all follow-up visits as told by your health care provider. This is important. How is this prevented? To lower your risk of developing this condition again:  For 30 or more minutes every day, do an activity that: ? Involves moving your arms and legs. ? Increases your heart rate.  When traveling for longer than four hours: ? Exercise your arms and legs every hour. ? Drink plenty of water. ? Avoid drinking alcohol.  Avoid sitting or lying for a  long time without moving your legs.  If you have surgery or you are hospitalized, ask about ways to prevent blood clots. These may include taking frequent walks or using anticoagulants.  Stay at a healthy weight.  If you are a woman who is older than age 74, avoid unnecessary use of medicines that contain estrogen, such as some birth control pills.  Do not use any products that contain nicotine or tobacco, such as cigarettes and e-cigarettes. This is especially important if you take estrogen medicines. If you need help quitting, ask your health care provider. Contact a health care provider if:  You miss a dose of your blood thinner.  Your menstrual period is heavier than usual.  You have unusual bruising. Get help right away if:  You have: ? New or increased pain, swelling, or redness in an arm or leg. ? Numbness or tingling in an arm or leg. ? Shortness of breath. ? Chest pain. ? A rapid or irregular heartbeat. ? A severe headache or confusion. ? A cut that will not stop bleeding.  There is blood in your vomit, stool, or urine.  You have a serious fall or accident, or you hit your head.  You feel light-headed or dizzy.  You cough up blood. These symptoms may represent a serious problem that is an emergency. Do not wait to see if the symptoms will go away. Get medical help right away. Call your  local emergency services (911 in the [place] Do not drive yourself to the hospital. Summary  Deep vein thrombosis (DVT) is a condition in which a blood clot forms in a deep vein, such as a lower leg, thigh, or arm vein.  Symptoms can include swelling, warmth, pain, and redness in your leg or arm.  This condition may be treated with a blood thinner (anticoagulant medicine), medicine that is injected to dissolve blood clots,compression stockings, or surgery.  If you are prescribed blood thinners, take them exactly as told. This information is not intended to replace advice given to you by your health care provider. Make sure you discuss any questions you have with your health care provider. Document Released: 11/13/2005 Document Revised: 10/26/2017 Document Reviewed: 04/13/2017 Elsevier Patient Education  [place].  Echocardiogram An echocardiogram is a procedure that uses painless sound waves (ultrasound) to produce an image of the heart. Images from an echocardiogram can provide important information about:  Signs of coronary artery disease (CAD).  Aneurysm detection. An aneurysm is a weak or damaged part of an artery wall that bulges out from the normal force of blood pumping through the body.  Heart size and shape. Changes in the size or shape of the heart can be associated with certain conditions, including heart failure, aneurysm, and CAD.  Heart muscle function.  Heart valve function.  Signs of a past heart attack.  Fluid buildup around the heart.  Thickening of the heart muscle.  A tumor or infectious growth around the heart valves. Tell a health care provider about:  Any allergies you have.  All medicines you are taking, including vitamins, herbs, eye drops, creams, and over-the-counter medicines.  Any blood disorders you have.  Any surgeries you have had.  Any medical conditions you have.  Whether you are pregnant or may be pregnant. What are the  risks? Generally, this is a safe procedure. However, problems may occur, including:  Allergic reaction to dye (contrast) that may be used during the procedure. What happens before the procedure? No specific preparation  is needed. You may eat and drink normally. What happens during the procedure?   An IV tube may be inserted into one of your veins.  You may receive contrast through this tube. A contrast is an injection that improves the quality of the pictures from your heart.  A gel will be applied to your chest.  A wand-like tool (transducer) will be moved over your chest. The gel will help to transmit the sound waves from the transducer.  The sound waves will harmlessly bounce off of your heart to allow the heart images to be captured in real-time motion. The images will be recorded on a computer. The procedure may vary among health care providers and hospitals. What happens after the procedure?  You may return to your normal, everyday life, including diet, activities, and medicines, unless your health care provider tells you not to do that. Summary  An echocardiogram is a procedure that uses painless sound waves (ultrasound) to produce an image of the heart.  Images from an echocardiogram can provide important information about the size and shape of your heart, heart muscle function, heart valve function, and fluid buildup around your heart.  You do not need to do anything to prepare before this procedure. You may eat and drink normally.  After the echocardiogram is completed, you may return to your normal, everyday life, unless your health care provider tells you not to do that. This information is not intended to replace advice given to you by your health care provider. Make sure you discuss any questions you have with your health care provider. Document Released: 11/10/2000 Document Revised: 03/06/2019 Document Reviewed: 12/16/2016 Elsevier Patient Education  2020 [employer].

## 2019-09-10 NOTE — Addendum Note (Signed)
Addended by: Beckey Rutter on: 09/10/2019 03:36 PM   Modules accepted: Orders

## 2019-09-10 NOTE — Addendum Note (Signed)
Addended by: Beckey Rutter on: 09/10/2019 03:11 PM   Modules accepted: Orders

## 2019-10-16 ENCOUNTER — Ambulatory Visit (INDEPENDENT_AMBULATORY_CARE_PROVIDER_SITE_OTHER): Payer: Medicare HMO

## 2019-10-16 ENCOUNTER — Telehealth: Payer: Self-pay | Admitting: Cardiology

## 2019-10-16 ENCOUNTER — Other Ambulatory Visit: Payer: Self-pay

## 2019-10-16 DIAGNOSIS — I480 Paroxysmal atrial fibrillation: Secondary | ICD-10-CM

## 2019-10-16 DIAGNOSIS — I10 Essential (primary) hypertension: Secondary | ICD-10-CM

## 2019-10-16 DIAGNOSIS — R600 Localized edema: Secondary | ICD-10-CM

## 2019-10-16 NOTE — Telephone Encounter (Signed)
Patient is in [place] and needs samples of Eliquis

## 2019-10-16 NOTE — Progress Notes (Signed)
Lower extremity venous duplex exam has been performed. No evidence of DVT.   Jimmy Kepler Mccabe RDCS, RVT

## 2019-10-16 NOTE — Progress Notes (Signed)
Complete echocardiogram has been performed.  Jimmy Geneive Sandstrom RDCS, RVT 

## 2019-10-16 NOTE — Telephone Encounter (Signed)
Attempted twice to call patient on cell and work # with no answer. Samples are ready for pickup in [place] office.

## 2019-11-03 ENCOUNTER — Encounter: Payer: Self-pay | Admitting: Cardiology

## 2019-11-03 ENCOUNTER — Ambulatory Visit (INDEPENDENT_AMBULATORY_CARE_PROVIDER_SITE_OTHER): Payer: Medicare HMO | Admitting: Cardiology

## 2019-11-03 ENCOUNTER — Other Ambulatory Visit: Payer: Self-pay

## 2019-11-03 VITALS — BP 122/64 | HR 67 | Ht 63.0 in | Wt 174.0 lb

## 2019-11-03 DIAGNOSIS — E782 Mixed hyperlipidemia: Secondary | ICD-10-CM

## 2019-11-03 DIAGNOSIS — I480 Paroxysmal atrial fibrillation: Secondary | ICD-10-CM

## 2019-11-03 DIAGNOSIS — G4730 Sleep apnea, unspecified: Secondary | ICD-10-CM | POA: Diagnosis not present

## 2019-11-03 DIAGNOSIS — I10 Essential (primary) hypertension: Secondary | ICD-10-CM | POA: Diagnosis not present

## 2019-11-03 NOTE — Progress Notes (Signed)
Cardiology Office Note:    Date:  11/03/2019   ID:  Jill Strickland, DOB [birth year], MRN [id number]  PCP:  Jill Broker, MD  Cardiologist:  Jill Lindau, MD   Referring MD: Jill Broker, MD    ASSESSMENT:    1. Essential hypertension   2. Paroxysmal atrial fibrillation (HCC)   3. Mixed dyslipidemia   4. Sleep apnea, unspecified type    PLAN:    In order of problems listed above:  1. Paroxysmal atrial fibrillation: I reviewed [place] records extensively and she had multiple questions which were answered to her satisfaction.I discussed with the patient atrial fibrillation, disease process. Management and therapy including rate and rhythm control, anticoagulation benefits and potential risks were discussed extensively with the patient. Patient had multiple questions which were answered to patient's satisfaction. 2. Hypokalemia: She has blood work, up with her primary care physician in the next few days and will get follow-up blood work for this from him according to the patient.  I told her to keep her diet a little better with potassium. 3. Essential hypertension: Blood pressure stable 4. Mixed dyslipidemia: Diet was discussed for this and lipids followed by primary care physician Sleep apnea: Sleep health issues were discussed: Patient had multiple questions which were answered to her satisfaction.  This and the above complete review of hospital records and emergency room visit was a 40-minute evaluation. Patient will be seen in follow-up appointment in 6 months or earlier if the patient has any concerns    Medication Adjustments/Labs and Tests Ordered: Current medicines are reviewed at length with the patient today.  Concerns regarding medicines are outlined above.  No orders of the defined types were placed in this encounter.  No orders of the defined types were placed in this encounter.    Chief Complaint  Patient presents with  . Follow up from ER      History of Present Illness:    Jill Strickland is a 78 y.o. female.  Patient has past medical history of paroxysmal atrial fibrillation, essential hypertension, mixed dyslipidemia and sleep apnea.  She mentions to me that recently she went to the hospital hospital with palpitations and elevated heart rate and was found to be in atrial fibrillation subsequently she converted to sinus.  She denies any problems at this time and takes care of activities of daily living.  No chest pain orthopnea or PND.  She mentions to me that the stress of Thanksgiving cooking may have precipitated this.  At the time of my evaluation, the patient is alert awake oriented and in no distress.  Past Medical History:  Diagnosis Date  . CHF (congestive heart failure) ([place]) 02/09/2016  . Edema 02/09/2016  . Essential hypertension 04/29/2015  . GERD without esophagitis 09/04/2018  . Hyperlipemia 02/09/2016  . Mixed dyslipidemia 09/04/2018  . Paroxysmal atrial fibrillation ([place]) 04/29/2015   Last Assessment & Plan:  First documented episode of atrial fibrillation lasting only for a few hours.she ended up going to the hospital. Converted spontaneously. Her chads score is 2. Hypertension and possibly congestive heart failure. She needs to be fully anticoagulated. Had a long discussion with her about that issue. I spoke with him explaining to her what's atrial fibrillation as was the rea  . Sleep apnea 04/29/2015   Overview:  SUSPECTED  . Vitamin D deficiency 02/09/2016    Past Surgical History:  Procedure Laterality Date  . APPENDECTOMY    . STOMACH SURGERY    .  TUBAL LIGATION      Current Medications: Current Meds  Medication Sig  . apixaban (ELIQUIS) 5 MG TABS tablet Take 5 mg by mouth 2 (two) times daily.  . calcium-vitamin D (OSCAL WITH D) 250-125 MG-UNIT tablet Take by mouth.  . furosemide (LASIX) 40 MG tablet Take 40 mg by mouth daily.  Marland Kitchen glucosamine-chondroitin 500-400 MG tablet Take 1 tablet by mouth 3 (three)  times daily.  . metoprolol tartrate (LOPRESSOR) 50 MG tablet Take 50 mg by mouth 2 (two) times daily.  Marland Kitchen omeprazole (PRILOSEC) 40 MG capsule Take 40 mg by mouth daily.  . potassium chloride (KLOR-CON) 10 MEQ tablet Take 10 mEq by mouth daily.   . rosuvastatin (CRESTOR) 20 MG tablet Take 20 mg by mouth daily.     Allergies:   Morphine, Penicillins, and Cefuroxime axetil   Social History   Socioeconomic History  . Marital status: Married    Spouse name: Not on file  . Number of children: Not on file  . Years of education: Not on file  . Highest education level: Not on file  Occupational History  . Not on file  Social Needs  . Financial resource strain: Not on file  . Food insecurity    Worry: Not on file    Inability: Not on file  . Transportation needs    Medical: Not on file    Non-medical: Not on file  Tobacco Use  . Smoking status: Never Smoker  . Smokeless tobacco: Never Used  Substance and Sexual Activity  . Alcohol use: Never    Frequency: Never  . Drug use: Never  . Sexual activity: Not on file  Lifestyle  . Physical activity    Days per week: Not on file    Minutes per session: Not on file  . Stress: Not on file  Relationships  . Social Herbalist on phone: Not on file    Gets together: Not on file    Attends religious service: Not on file    Active member of club or organization: Not on file    Attends meetings of clubs or organizations: Not on file    Relationship status: Not on file  Other Topics Concern  . Not on file  Social History Narrative  . Not on file     Family History: The patient's family history includes Heart attack in her brother and father; Heart disease in her mother; Hyperlipidemia in her sister; Hypertension in her mother and sister; Osteoporosis in her mother; Thyroid disease in her mother.  ROS:   Please see the history of present illness.    All other systems reviewed and are negative.  EKGs/Labs/Other Studies  Reviewed:    The following studies were reviewed today:  IMPRESSIONS    1. Left ventricular ejection fraction, by visual estimation, is 60 to 65%. The left ventricle has normal function. Left ventricular septal wall thickness was mildly increased. Mildly increased left ventricular posterior wall thickness. There is mildly  increased left ventricular hypertrophy.  2. Left ventricular diastolic parameters are consistent with Grade II diastolic dysfunction (pseudonormalization).  3. Left atrial size was mildly dilated.  4. Mild aortic valve annular calcification.  5. The aortic valve is normal in structure. Aortic valve regurgitation is not visualized. No evidence of aortic valve sclerosis or stenosis.  6. The mitral valve is normal in structure. Trace mitral valve regurgitation. No evidence of mitral stenosis.  7. The tricuspid valve is normal in structure. Tricuspid  valve regurgitation mild-moderate.  8. The pulmonic valve was normal in structure. Pulmonic valve regurgitation is not visualized.  9. Normal pulmonary artery systolic pressure.   Recent Labs: No results found for requested labs within last 8760 hours.  Recent Lipid Panel No results found for: CHOL, TRIG, HDL, CHOLHDL, VLDL, LDLCALC, LDLDIRECT  Physical Exam:    VS:  BP 122/64   Pulse 67   Ht 5\' 3"  (1.6 m)   Wt 174 lb (78.9 kg)   SpO2 98%   BMI 30.82 kg/m     Wt Readings from Last 3 Encounters:  11/03/19 174 lb (78.9 kg)  09/10/19 185 lb 6.4 oz (84.1 kg)     GEN: Patient is in no acute distress HEENT: Normal NECK: No JVD; No carotid bruits LYMPHATICS: No lymphadenopathy CARDIAC: Hear sounds regular, 2/6 systolic murmur at the apex. RESPIRATORY:  Clear to auscultation without rales, wheezing or rhonchi  ABDOMEN: Soft, non-tender, non-distended MUSCULOSKELETAL:  No edema; No deformity  SKIN: Warm and dry NEUROLOGIC:  Alert and oriented x 3 PSYCHIATRIC:  Normal affect   Signed, Jill Lindau, MD   11/03/2019 9:34 AM    [place]

## 2019-11-03 NOTE — Patient Instructions (Signed)

## 2019-11-03 NOTE — Addendum Note (Signed)
Addended by: Beckey Rutter on: 11/03/2019 02:26 PM   Modules accepted: Orders

## 2019-11-05 DIAGNOSIS — E538 Deficiency of other specified B group vitamins: Secondary | ICD-10-CM

## 2019-11-05 HISTORY — DX: Deficiency of other specified B group vitamins: E53.8

## 2020-02-11 ENCOUNTER — Telehealth: Payer: Self-pay

## 2020-02-11 NOTE — Telephone Encounter (Signed)
Patient with diagnosis of atrial fibrillation on Eliquis for anticoagulation.    Procedure: colonoscopy Date of procedure: 02/19/20  CHADS2-VASc score of 4 (HTN, AGEx2, female). Note: CHF is listed on problem list however ECHO 09/2019 revealed EF 60-65%.  CrCl >60 Platelet count 291  Per office protocol, patient can hold Eliquis for 1-2 days prior to procedure.

## 2020-02-11 NOTE — Telephone Encounter (Signed)
   [facility] Pre-operative Risk Assessment    Request for surgical clearance:  1. What type of surgery is being performed? Colonoscopy   2. When is this surgery scheduled? 02/19/2020   3. What type of clearance is required (medical clearance vs. Pharmacy clearance to hold med vs. Both)? Both   4. Are there any medications that need to be held prior to surgery and how long? Eliquis on 3/22/20210  5. Practice name and name of physician performing surgery? [facility] , Kyra Leyland, MD    6. What is your office phone number [phone]   7.   What is your office fax number [phone]  8.   Anesthesia type (None, local, MAC, general) ? None    Mendel Ryder 02/11/2020, 10:16 AM  _________________________________________________________________   (provider comments below)

## 2020-05-10 ENCOUNTER — Ambulatory Visit: Payer: Medicare HMO | Admitting: Cardiology

## 2020-05-10 ENCOUNTER — Encounter: Payer: Self-pay | Admitting: Cardiology

## 2020-05-10 ENCOUNTER — Other Ambulatory Visit: Payer: Self-pay

## 2020-05-10 VITALS — BP 130/70 | HR 52 | Ht 63.0 in | Wt 175.6 lb

## 2020-05-10 DIAGNOSIS — E782 Mixed hyperlipidemia: Secondary | ICD-10-CM | POA: Diagnosis not present

## 2020-05-10 DIAGNOSIS — I480 Paroxysmal atrial fibrillation: Secondary | ICD-10-CM | POA: Diagnosis not present

## 2020-05-10 DIAGNOSIS — I10 Essential (primary) hypertension: Secondary | ICD-10-CM | POA: Diagnosis not present

## 2020-05-10 DIAGNOSIS — G4730 Sleep apnea, unspecified: Secondary | ICD-10-CM

## 2020-05-10 MED ORDER — FISH OIL BURP-LESS 1000 MG PO CAPS: 2.0000 g | ORAL_CAPSULE | Freq: Every day | ORAL | 3 refills | Status: DC

## 2020-05-10 NOTE — Patient Instructions (Signed)
Medication Instructions:  Your physician has recommended you make the following change in your medication:   Start 2 gms fish oil daily.  *If you need a refill on your cardiac medications before your next appointment, please call your pharmacy*   Lab Work: None ordered If you have labs (blood work) drawn today and your tests are completely normal, you will receive your results only by: Marland Kitchen MyChart Message (if you have MyChart) OR . A paper copy in the mail If you have any lab test that is abnormal or we need to change your treatment, we will call you to review the results.   Testing/Procedures: None ordered   Follow-Up: At [facility], you and your health needs are our priority.  As part of our continuing mission to provide you with exceptional heart care, we have created designated Provider Care Teams.  These Care Teams include your primary Cardiologist (physician) and Advanced Practice Providers (APPs -  Physician Assistants and Nurse Practitioners) who all work together to provide you with the care you need, when you need it.  We recommend signing up for the patient portal called "MyChart".  Sign up information is provided on this After Visit Summary.  MyChart is used to connect with patients for Virtual Visits (Telemedicine).  Patients are able to view lab/test results, encounter notes, upcoming appointments, etc.  Non-urgent messages can be sent to your provider as well.   To learn more about what you can do with MyChart, go to [email or web address].    Your next appointment:   6 month(s)  The format for your next appointment:   In Person  Provider:   Jyl Heinz, MD   Other Instructions Fish Oil, Omega-3 Fatty Acids capsules (OTC) What is this medicine? FISH OIL, OMEGA-3 FATTY ACIDS (Fish Oil, oh MAY ga - 3 fatty AS ids) are essential fats. It is promoted to help support a healthy heart. This dietary supplement is used to add to a healthy diet. The FDA has not  approved this supplement for any medical use. This supplement may be used for other purposes; ask your health care provider or pharmacist if you have questions. This medicine may be used for other purposes; ask your health care provider or pharmacist if you have questions. COMMON BRAND NAME(S): Omega-3, Omega-3 Fish Oil, OMEGA-3 IQ DHA, TherOmega, THEROMEGA SPORT What should I tell my health care provider before I take this medicine? They need to know if you have any of these conditions  bleeding problems  lung or breathing disease, like asthma  an unusual or allergic reaction to fish oil, omega-3 fatty acids, fish, other medicines, foods, dyes, or preservatives  pregnant or trying to get pregnant  breast-feeding How should I use this medicine? Take this medicine by mouth with a glass of water. Follow the directions on the package or prescription label. Take with food. Take your medicine at regular intervals. Do not take your medicine more often than directed. Talk to your pediatrician regarding the use of this medicine in children. Special care may be needed. This medicine should not be used in children without a doctor's advice. Overdosage: If you think you have taken too much of this medicine contact a poison control center or emergency room at once. NOTE: This medicine is only for you. Do not share this medicine with others. What if I miss a dose? If you miss a dose, take it as soon as you can. If it is almost time for your next dose, take only  that dose. Do not take double or extra doses. What may interact with this medicine?  aspirin and aspirin-like medicines  herbal products like danshen, dong quai, garlic pills, ginger, ginkgo biloba, horse chestnut, willow bark, and others  medicines that treat or prevent blood clots like enoxaparin, heparin, warfarin This list may not describe all possible interactions. Give your health care provider a list of all the medicines, herbs,  non-prescription drugs, or dietary supplements you use. Also tell them if you smoke, drink alcohol, or use illegal drugs. Some items may interact with your medicine. What should I watch for while using this medicine? Follow a good diet and exercise plan. Taking a dietary supplement does not replace a healthy lifestyle. Some foods that have omega-3 fatty acids naturally are fatty fish like albacore tuna, halibut, herring, mackerel, lake trout, salmon, and sardines. Too much of this supplement can be unsafe. Talk to your doctor or health care provider about how much of this supplement is right for you. If you are scheduled for any medical or dental procedure, tell your healthcare provider that you are taking this medicine. You may need to stop taking this medicine before the procedure. Herbal or dietary supplements are not regulated like medicines. Rigid quality control standards are not required for dietary supplements. The purity and strength of these products can vary. The safety and effect of this dietary supplement for a certain disease or illness is not well known. This product is not intended to diagnose, treat, cure or prevent any disease. The Food and Drug Administration suggests the following to help consumers protect themselves:  Always read product labels and follow directions.  Natural does not mean a product is safe for humans to take.  Look for products that include USP after the ingredient name. This means that the manufacturer followed the standards of the [place] Pharmacopoeia.  Supplements made or sold by a nationally known food or drug company are more likely to be made under tight controls. You can write to the company for more information about how the product was made. What side effects may I notice from receiving this medicine? Side effects that you should report to your doctor or health care professional as soon as possible:  allergic reactions like skin rash, itching or hives,  swelling of the face, lips, or tongue  breathing problems  changes in your moods or emotions  unusual bleeding or bruising Side effects that usually do not require medical attention (report to your doctor or health care professional if they continue or are bothersome):  bad or fishy breath  belching  diarrhea  nausea  stomach gas, upset  weight gain This list may not describe all possible side effects. Call your doctor for medical advice about side effects. You may report side effects to FDA at 1-800-FDA-1088. Where should I keep my medicine? Keep out of the reach of children. Store at room temperature or as directed on the package label. Protect from moisture. Do not freeze. Throw away any unused medicine after the expiration date. NOTE: This sheet is a summary. It may not cover all possible information. If you have questions about this medicine, talk to your doctor, pharmacist, or health care provider.  2020 Elsevier/Gold Standard (2015-03-04 09:36:32)

## 2020-05-10 NOTE — Progress Notes (Signed)
Cardiology Office Note:    Date:  05/10/2020   ID:  Jill Strickland, DOB [birth year], MRN [id number]  PCP:  Jill Broker, MD  Cardiologist:  Jenean Lindau, MD   Referring MD: Jill Broker, MD    ASSESSMENT:    1. Essential hypertension   2. Paroxysmal atrial fibrillation (HCC)   3. Mixed dyslipidemia   4. Sleep apnea, unspecified type    PLAN:    In order of problems listed above:  1. Primary prevention stressed with the patient.  Importance of compliance with diet medication stressed and she vocalized understanding. 2. Essential hypertension: Blood pressure is stable. 3. Mixed dyslipidemia: Lipids lipids were reviewed her diet was revisited and had an extensive discussion with her about this.  I told her to cut out carbs and fried fatty foods in the diet.  She has been not very compliant with this.  She was also advised to take fish oil 2 g twice daily.  She will get a blood check in about 3 to 4 months. 4. Paroxysmal atrial fibrillation: I discussed with the patient atrial fibrillation, disease process. Management and therapy including rate and rhythm control, anticoagulation benefits and potential risks were discussed extensively with the patient. Patient had multiple questions which were answered to patient's satisfaction. 5. Sleep apnea: Sleep health issues were discussed 6. Patient will be seen in follow-up appointment in 6 months or earlier if the patient has any concerns    Medication Adjustments/Labs and Tests Ordered: Current medicines are reviewed at length with the patient today.  Concerns regarding medicines are outlined above.  No orders of the defined types were placed in this encounter.  No orders of the defined types were placed in this encounter.   No chief complaint on file.    History of Present Illness:    Jill Strickland is a 79 y.o. female.  Patient has past medical history of paroxysmal atrial fibrillation, essential hypertension and  dyslipidemia.  She has hypertriglyceridemia.  She denies any problems at this time and takes care of activities of daily living.  She has been lax with diet and exercise.  At the time of my evaluation, the patient is alert awake oriented and in no distress.  Past Medical History:  Diagnosis Date  . CHF (congestive heart failure) ([place]) 02/09/2016  . Edema 02/09/2016  . Essential hypertension 04/29/2015  . GERD without esophagitis 09/04/2018  . Hyperlipemia 02/09/2016  . Mixed dyslipidemia 09/04/2018  . Paroxysmal atrial fibrillation ([place]) 04/29/2015   Last Assessment & Plan:  First documented episode of atrial fibrillation lasting only for a few hours.she ended up going to the hospital. Converted spontaneously. Her chads score is 2. Hypertension and possibly congestive heart failure. She needs to be fully anticoagulated. Had a long discussion with her about that issue. I spoke with him explaining to her what's atrial fibrillation as was the rea  . Sleep apnea 04/29/2015   Overview:  SUSPECTED  . Vitamin D deficiency 02/09/2016    Past Surgical History:  Procedure Laterality Date  . APPENDECTOMY    . STOMACH SURGERY    . TUBAL LIGATION      Current Medications: Current Meds  Medication Sig  . apixaban (ELIQUIS) 5 MG TABS tablet Take 5 mg by mouth 2 (two) times daily.  . calcium-vitamin D (OSCAL WITH D) 250-125 MG-UNIT tablet Take by mouth.  . furosemide (LASIX) 40 MG tablet Take 40 mg by mouth daily.  Marland Kitchen glucosamine-chondroitin 500-400 MG tablet  Take 1 tablet by mouth 3 (three) times daily.  . metoprolol tartrate (LOPRESSOR) 50 MG tablet Take 50 mg by mouth 2 (two) times daily.  Marland Kitchen omeprazole (PRILOSEC) 40 MG capsule Take 40 mg by mouth daily.  . pantoprazole (PROTONIX) 40 MG tablet   . potassium chloride (KLOR-CON) 10 MEQ tablet Take 10 mEq by mouth daily.   . psyllium (METAMUCIL) 58.6 % packet Take by mouth.  . rosuvastatin (CRESTOR) 20 MG tablet Take 20 mg by mouth daily.     Allergies:    Morphine, Penicillins, and Cefuroxime axetil   Social History   Socioeconomic History  . Marital status: Married    Spouse name: Not on file  . Number of children: Not on file  . Years of education: Not on file  . Highest education level: Not on file  Occupational History  . Not on file  Tobacco Use  . Smoking status: Never Smoker  . Smokeless tobacco: Never Used  Vaping Use  . Vaping Use: Never used  Substance and Sexual Activity  . Alcohol use: Never  . Drug use: Never  . Sexual activity: Not on file  Other Topics Concern  . Not on file  Social History Narrative  . Not on file   Social Determinants of Health   Financial Resource Strain:   . Difficulty of Paying Living Expenses:   Food Insecurity:   . Worried About Charity fundraiser in the Last Year:   . Arboriculturist in the Last Year:   Transportation Needs:   . Film/video editor (Medical):   Marland Kitchen Lack of Transportation (Non-Medical):   Physical Activity:   . Days of Exercise per Week:   . Minutes of Exercise per Session:   Stress:   . Feeling of Stress :   Social Connections:   . Frequency of Communication with Friends and Family:   . Frequency of Social Gatherings with Friends and Family:   . Attends Religious Services:   . Active Member of Clubs or Organizations:   . Attends Archivist Meetings:   Marland Kitchen Marital Status:      Family History: The patient's family history includes Heart attack in her brother and father; Heart disease in her mother; Hyperlipidemia in her sister; Hypertension in her mother and sister; Osteoporosis in her mother; Thyroid disease in her mother.  ROS:   Please see the history of present illness.    All other systems reviewed and are negative.  EKGs/Labs/Other Studies Reviewed:    The following studies were reviewed today: IMPRESSIONS    1. Left ventricular ejection fraction, by visual estimation, is 60 to  65%. The left ventricle has normal function. Left  ventricular septal wall  thickness was mildly increased. Mildly increased left ventricular  posterior wall thickness. There is mildly  increased left ventricular hypertrophy.  2. Left ventricular diastolic parameters are consistent with Grade II  diastolic dysfunction (pseudonormalization).  3. Left atrial size was mildly dilated.  4. Mild aortic valve annular calcification.  5. The aortic valve is normal in structure. Aortic valve regurgitation is  not visualized. No evidence of aortic valve sclerosis or stenosis.  6. The mitral valve is normal in structure. Trace mitral valve  regurgitation. No evidence of mitral stenosis.  7. The tricuspid valve is normal in structure. Tricuspid valve  regurgitation mild-moderate.  8. The pulmonic valve was normal in structure. Pulmonic valve  regurgitation is not visualized.  9. Normal pulmonary artery systolic pressure.  Recent Labs: No results found for requested labs within last 8760 hours.  Recent Lipid Panel No results found for: CHOL, TRIG, HDL, CHOLHDL, VLDL, LDLCALC, LDLDIRECT  Physical Exam:    VS:  BP 130/70   Pulse (!) 52   Ht 5\' 3"  (1.6 m)   Wt 175 lb 9.6 oz (79.7 kg)   SpO2 97%   BMI 31.11 kg/m     Wt Readings from Last 3 Encounters:  05/10/20 175 lb 9.6 oz (79.7 kg)  11/03/19 174 lb (78.9 kg)  09/10/19 185 lb 6.4 oz (84.1 kg)     GEN: Patient is in no acute distress HEENT: Normal NECK: No JVD; No carotid bruits LYMPHATICS: No lymphadenopathy CARDIAC: Hear sounds regular, 2/6 systolic murmur at the apex. RESPIRATORY:  Clear to auscultation without rales, wheezing or rhonchi  ABDOMEN: Soft, non-tender, non-distended MUSCULOSKELETAL:  No edema; No deformity  SKIN: Warm and dry NEUROLOGIC:  Alert and oriented x 3 PSYCHIATRIC:  Normal affect   Signed, Jenean Lindau, MD  05/10/2020 9:49 AM    [place]

## 2020-08-18 DIAGNOSIS — Z23 Encounter for immunization: Secondary | ICD-10-CM

## 2020-08-18 HISTORY — DX: Encounter for immunization: Z23

## 2020-11-10 ENCOUNTER — Ambulatory Visit: Payer: Medicare HMO | Admitting: Cardiology

## 2020-11-11 ENCOUNTER — Ambulatory Visit: Payer: Medicare HMO | Admitting: Cardiology

## 2020-11-11 ENCOUNTER — Other Ambulatory Visit: Payer: Self-pay

## 2020-11-11 ENCOUNTER — Encounter: Payer: Self-pay | Admitting: Cardiology

## 2020-11-11 VITALS — BP 138/68 | HR 67 | Ht 63.0 in | Wt 180.0 lb

## 2020-11-11 DIAGNOSIS — E782 Mixed hyperlipidemia: Secondary | ICD-10-CM | POA: Diagnosis not present

## 2020-11-11 DIAGNOSIS — G4730 Sleep apnea, unspecified: Secondary | ICD-10-CM | POA: Diagnosis not present

## 2020-11-11 DIAGNOSIS — I10 Essential (primary) hypertension: Secondary | ICD-10-CM | POA: Diagnosis not present

## 2020-11-11 DIAGNOSIS — I480 Paroxysmal atrial fibrillation: Secondary | ICD-10-CM | POA: Diagnosis not present

## 2020-11-11 NOTE — Patient Instructions (Signed)

## 2020-11-11 NOTE — Progress Notes (Signed)
Cardiology Office Note:    Date:  11/11/2020   ID:  Jill Strickland, DOB 1941/02/23, MRN [id number]  PCP:  Myrlene Broker, MD  Cardiologist:  Jenean Lindau, MD   Referring MD: Myrlene Broker, MD    ASSESSMENT:    1. Paroxysmal atrial fibrillation (HCC)   2. Essential hypertension   3. Mixed hyperlipidemia   4. Sleep apnea, unspecified type   5. Mixed dyslipidemia    PLAN:    In order of problems listed above:  1. Primary prevention stressed with the patient.  Importance of compliance with diet medication stressed and she vocalized understanding. 2. Essential hypertension: Blood pressure is stable and diet was emphasized. 3. Mixed dyslipidemia: I reviewed lab work done by primary care physician and discussed with the patient extensively.  She vocalized understanding. 4. Sleep apnea: Sleep health issues were discussed. 5. Paroxysmal atrial fibrillation:I discussed with the patient atrial fibrillation, disease process. Management and therapy including rate and rhythm control, anticoagulation benefits and potential risks were discussed extensively with the patient. Patient had multiple questions which were answered to patient's satisfaction. 6. Patient will be seen in follow-up appointment in 6 months or earlier if the patient has any concerns    Medication Adjustments/Labs and Tests Ordered: Current medicines are reviewed at length with the patient today.  Concerns regarding medicines are outlined above.  No orders of the defined types were placed in this encounter.  No orders of the defined types were placed in this encounter.    No chief complaint on file.    History of Present Illness:    Jill Strickland is a 79 y.o. female.  Patient has past medical history of essential hypertension dyslipidemia diabetes mellitus and obesity.  She leads a sedentary lifestyle.  She is done better with diet and triglycerides have gone lower but not up to the mark.  No chest pain  orthopnea or PND.  At the time of my evaluation, the patient is alert awake oriented and in no distress.  Past Medical History:  Diagnosis Date  . CHF (congestive heart failure) ([place]) 02/09/2016  . Edema 02/09/2016  . Essential hypertension 04/29/2015  . GERD without esophagitis 09/04/2018  . Hyperlipemia 02/09/2016  . Mixed dyslipidemia 09/04/2018  . Need for immunization against influenza 08/18/2020  . Paroxysmal atrial fibrillation ([place]) 04/29/2015   Last Assessment & Plan:  First documented episode of atrial fibrillation lasting only for a few hours.she ended up going to the hospital. Converted spontaneously. Her chads score is 2. Hypertension and possibly congestive heart failure. She needs to be fully anticoagulated. Had a long discussion with her about that issue. I spoke with him explaining to her what's atrial fibrillation as was the rea  . Pedal edema 09/10/2019  . Sleep apnea 04/29/2015   Overview:  SUSPECTED  . Vitamin B12 deficiency 11/05/2019  . Vitamin D deficiency 02/09/2016    Past Surgical History:  Procedure Laterality Date  . APPENDECTOMY    . STOMACH SURGERY    . TUBAL LIGATION      Current Medications: Current Meds  Medication Sig  . apixaban (ELIQUIS) 5 MG TABS tablet Take 5 mg by mouth 2 (two) times daily.  . calcium-vitamin D (OSCAL WITH D) 250-125 MG-UNIT tablet Take by mouth.  . furosemide (LASIX) 40 MG tablet Take 40 mg by mouth daily.  Marland Kitchen glucosamine-chondroitin 500-400 MG tablet Take 3 tablets by mouth daily in the afternoon.  . metoprolol tartrate (LOPRESSOR) 50 MG tablet Take  50 mg by mouth 2 (two) times daily.  . Omega-3 Fatty Acids (FISH OIL) 1000 MG CAPS Take 1,000 mg by mouth 2 (two) times daily.  Marland Kitchen omeprazole (PRILOSEC) 40 MG capsule Take 40 mg by mouth daily.  . potassium chloride (KLOR-CON) 10 MEQ tablet Take 10 mEq by mouth daily.   . rosuvastatin (CRESTOR) 20 MG tablet Take 20 mg by mouth daily.     Allergies:   Morphine, Penicillins, and Cefuroxime  axetil   Social History   Socioeconomic History  . Marital status: Married    Spouse name: Not on file  . Number of children: Not on file  . Years of education: Not on file  . Highest education level: Not on file  Occupational History  . Not on file  Tobacco Use  . Smoking status: Never Smoker  . Smokeless tobacco: Never Used  Vaping Use  . Vaping Use: Never used  Substance and Sexual Activity  . Alcohol use: Never  . Drug use: Never  . Sexual activity: Not on file  Other Topics Concern  . Not on file  Social History Narrative  . Not on file   Social Determinants of Health   Financial Resource Strain: Not on file  Food Insecurity: Not on file  Transportation Needs: Not on file  Physical Activity: Not on file  Stress: Not on file  Social Connections: Not on file     Family History: The patient's family history includes Heart attack in her brother and father; Heart disease in her mother; Hyperlipidemia in her sister; Hypertension in her mother and sister; Osteoporosis in her mother; Thyroid disease in her mother.  ROS:   Please see the history of present illness.    All other systems reviewed and are negative.  EKGs/Labs/Other Studies Reviewed:    The following studies were reviewed today: EKG reveals sinus rhythm left axis deviation and nonspecific ST-T changes   Recent Labs: No results found for requested labs within last 8760 hours.  Recent Lipid Panel No results found for: CHOL, TRIG, HDL, CHOLHDL, VLDL, LDLCALC, LDLDIRECT  Physical Exam:    VS:  BP 138/68   Pulse 67   Ht 5\' 3"  (1.6 m)   Wt 180 lb (81.6 kg)   SpO2 97%   BMI 31.89 kg/m     Wt Readings from Last 3 Encounters:  11/11/20 180 lb (81.6 kg)  05/10/20 175 lb 9.6 oz (79.7 kg)  11/03/19 174 lb (78.9 kg)     GEN: Patient is in no acute distress HEENT: Normal NECK: No JVD; No carotid bruits LYMPHATICS: No lymphadenopathy CARDIAC: Hear sounds regular, 2/6 systolic murmur at the  apex. RESPIRATORY:  Clear to auscultation without rales, wheezing or rhonchi  ABDOMEN: Soft, non-tender, non-distended MUSCULOSKELETAL:  No edema; No deformity  SKIN: Warm and dry NEUROLOGIC:  Alert and oriented x 3 PSYCHIATRIC:  Normal affect   Signed, Jenean Lindau, MD  11/11/2020 1:46 PM    [facility]

## 2021-02-15 DIAGNOSIS — R7309 Other abnormal glucose: Secondary | ICD-10-CM

## 2021-02-15 DIAGNOSIS — R7303 Prediabetes: Secondary | ICD-10-CM | POA: Insufficient documentation

## 2021-02-15 HISTORY — DX: Other abnormal glucose: R73.09

## 2021-02-15 HISTORY — DX: Prediabetes: R73.03

## 2021-03-09 DIAGNOSIS — R600 Localized edema: Secondary | ICD-10-CM

## 2021-03-09 DIAGNOSIS — Z1211 Encounter for screening for malignant neoplasm of colon: Secondary | ICD-10-CM

## 2021-03-09 DIAGNOSIS — N1831 Chronic kidney disease, stage 3a: Secondary | ICD-10-CM

## 2021-03-09 DIAGNOSIS — I509 Heart failure, unspecified: Secondary | ICD-10-CM | POA: Insufficient documentation

## 2021-03-09 HISTORY — DX: Localized edema: R60.0

## 2021-03-09 HISTORY — DX: Chronic kidney disease, stage 3a: N18.31

## 2021-03-09 HISTORY — DX: Heart failure, unspecified: I50.9

## 2021-03-09 HISTORY — DX: Encounter for screening for malignant neoplasm of colon: Z12.11

## 2021-05-16 ENCOUNTER — Ambulatory Visit: Payer: Medicare HMO | Admitting: Cardiology

## 2021-05-16 ENCOUNTER — Encounter: Payer: Self-pay | Admitting: Cardiology

## 2021-05-16 ENCOUNTER — Other Ambulatory Visit: Payer: Self-pay

## 2021-05-16 VITALS — BP 146/72 | HR 76 | Ht 62.0 in | Wt 186.4 lb

## 2021-05-16 DIAGNOSIS — Z23 Encounter for immunization: Secondary | ICD-10-CM | POA: Diagnosis not present

## 2021-05-16 DIAGNOSIS — E782 Mixed hyperlipidemia: Secondary | ICD-10-CM

## 2021-05-16 DIAGNOSIS — I10 Essential (primary) hypertension: Secondary | ICD-10-CM | POA: Diagnosis not present

## 2021-05-16 DIAGNOSIS — E875 Hyperkalemia: Secondary | ICD-10-CM

## 2021-05-16 DIAGNOSIS — I480 Paroxysmal atrial fibrillation: Secondary | ICD-10-CM | POA: Diagnosis not present

## 2021-05-16 DIAGNOSIS — E781 Pure hyperglyceridemia: Secondary | ICD-10-CM

## 2021-05-16 LAB — LIPID PANEL
Chol/HDL Ratio: 5 ratio — ABNORMAL HIGH (ref 0.0–4.4)
Cholesterol, Total: 149 mg/dL (ref 100–199)
HDL: 30 mg/dL — ABNORMAL LOW (ref >39)
LDL Chol Calc (NIH): 61 mg/dL (ref 0–99)
Triglycerides: 376 mg/dL — ABNORMAL HIGH (ref 0–149)
VLDL Cholesterol Cal: 58 mg/dL — ABNORMAL HIGH (ref 5–40)

## 2021-05-16 LAB — BASIC METABOLIC PANEL
BUN/Creatinine Ratio: 17 (ref 12–28)
BUN: 13 mg/dL (ref 8–27)
CO2: 23 mmol/L (ref 20–29)
Calcium: 10.1 mg/dL (ref 8.7–10.3)
Chloride: 107 mmol/L — ABNORMAL HIGH (ref 96–106)
Creatinine, Ser: 0.78 mg/dL (ref 0.57–1.00)
Glucose: 100 mg/dL — ABNORMAL HIGH (ref 65–99)
Potassium: 5.3 mmol/L — ABNORMAL HIGH (ref 3.5–5.2)
Sodium: 143 mmol/L (ref 134–144)
eGFR: 77 mL/min/{1.73_m2} (ref >59)

## 2021-05-16 LAB — HEPATIC FUNCTION PANEL
ALT: 18 IU/L (ref 0–32)
AST: 19 IU/L (ref 0–40)
Albumin: 4.3 g/dL (ref 3.7–4.7)
Alkaline Phosphatase: 74 IU/L (ref 44–121)
Bilirubin Total: 0.5 mg/dL (ref 0.0–1.2)
Bilirubin, Direct: 0.13 mg/dL (ref 0.00–0.40)
Total Protein: 6.5 g/dL (ref 6.0–8.5)

## 2021-05-16 NOTE — Progress Notes (Signed)
Cardiology Office Note:    Date:  05/16/2021   ID:  Jill Strickland, DOB 11-02-1941, MRN [id number]  PCP:  Myrlene Broker, MD  Cardiologist:  Jenean Lindau, MD   Referring MD: Myrlene Broker, MD    ASSESSMENT:    1. Essential hypertension   2. Paroxysmal atrial fibrillation (HCC)   3. Need for immunization against influenza   4. Mixed dyslipidemia    PLAN:    In order of problems listed above:  Primary prevention stressed with glucocorticoids of compliance with diet medication stressed and she vocalized understanding. Paroxysmal atrial fibrillation:I discussed with the patient atrial fibrillation, disease process. Management and therapy including rate and rhythm control, anticoagulation benefits and potential risks were discussed extensively with the patient. Patient had multiple questions which were answered to patient's satisfaction. Essential hypertension: Blood pressure stable and diet was emphasized. Mixed dyslipidemia: Lipids were reviewed.  She is doing well with fish oil and diet.  Exercise stressed.  We will do blood work today as she is fasting. Patient will be seen in follow-up appointment in 6 months or earlier if the patient has any concerns    Medication Adjustments/Labs and Tests Ordered: Current medicines are reviewed at length with the patient today.  Concerns regarding medicines are outlined above.  Orders Placed This Encounter  Procedures   Basic metabolic panel   Hepatic function panel   Lipid panel   No orders of the defined types were placed in this encounter.    No chief complaint on file.    History of Present Illness:    Jill Strickland is a 80 y.o. female.  Patient has past medical history of paroxysmal atrial fibrillation, essential hypertension and dyslipidemia.  She denies any problems at this time and takes care of activities of daily living.  She leads a sedentary lifestyle.  At the time of my evaluation, the patient is alert awake  oriented and in no distress.  She denies any history of palpitations or any such issues.  Past Medical History:  Diagnosis Date   CHF (congestive heart failure) ([place]) 02/09/2016   Chronic congestive heart failure ([place]) 03/09/2021   Edema 02/09/2016   Elevated glucose 02/15/2021   Essential hypertension 04/29/2015   GERD without esophagitis 09/04/2018   Hyperlipemia 02/09/2016   Lower leg edema 03/09/2021   Mixed dyslipidemia 09/04/2018   Need for immunization against influenza 08/18/2020   Paroxysmal atrial fibrillation ([place]) 04/29/2015   Last Assessment & Plan:  First documented episode of atrial fibrillation lasting only for a few hours.she ended up going to the hospital. Converted spontaneously. Her chads score is 2. Hypertension and possibly congestive heart failure. She needs to be fully anticoagulated. Had a long discussion with her about that issue. I spoke with him explaining to her what's atrial fibrillation as was the rea   Pedal edema 09/10/2019   Screening for colon cancer 03/09/2021   Sleep apnea 04/29/2015   Overview:  SUSPECTED   Stage 3a chronic kidney disease ([place]) 03/09/2021   Vitamin B12 deficiency 11/05/2019   Vitamin D deficiency 02/09/2016    Past Surgical History:  Procedure Laterality Date   APPENDECTOMY     STOMACH SURGERY     TUBAL LIGATION      Current Medications: Current Meds  Medication Sig   apixaban (ELIQUIS) 5 MG TABS tablet Take 5 mg by mouth 2 (two) times daily.   calcium-vitamin D (OSCAL WITH D) 250-125 MG-UNIT tablet Take 1 tablet by mouth daily.  Cyanocobalamin (B-12 PO) Take 1 tablet by mouth daily.   furosemide (LASIX) 40 MG tablet Take 40 mg by mouth daily.   metoprolol tartrate (LOPRESSOR) 50 MG tablet Take 50 mg by mouth 2 (two) times daily.   Omega-3 Fatty Acids (FISH OIL) 1000 MG CAPS Take 1,000 mg by mouth 2 (two) times daily.   omeprazole (PRILOSEC) 40 MG capsule Take 40 mg by mouth daily.   potassium chloride (KLOR-CON) 10 MEQ tablet Take 10 mEq  by mouth daily.    rosuvastatin (CRESTOR) 20 MG tablet Take 20 mg by mouth daily.     Allergies:   Morphine, Penicillins, and Cefuroxime axetil   Social History   Socioeconomic History   Marital status: Married    Spouse name: Not on file   Number of children: Not on file   Years of education: Not on file   Highest education level: Not on file  Occupational History   Not on file  Tobacco Use   Smoking status: Never   Smokeless tobacco: Never  Vaping Use   Vaping Use: Never used  Substance and Sexual Activity   Alcohol use: Never   Drug use: Never   Sexual activity: Not on file  Other Topics Concern   Not on file  Social History Narrative   Not on file   Social Determinants of Health   Financial Resource Strain: Not on file  Food Insecurity: Not on file  Transportation Needs: Not on file  Physical Activity: Not on file  Stress: Not on file  Social Connections: Not on file     Family History: The patient's family history includes Heart attack in her brother and father; Heart disease in her mother; Hyperlipidemia in her sister; Hypertension in her mother and sister; Osteoporosis in her mother; Thyroid disease in her mother.  ROS:   Please see the history of present illness.    All other systems reviewed and are negative.  EKGs/Labs/Other Studies Reviewed:    The following studies were reviewed today: EKG reveals sinus rhythm with nonspecific ST-T changes   Recent Labs: No results found for requested labs within last 8760 hours.  Recent Lipid Panel No results found for: CHOL, TRIG, HDL, CHOLHDL, VLDL, LDLCALC, LDLDIRECT  Physical Exam:    VS:  BP (!) 146/72   Pulse 76   Ht 5\' 2"  (1.575 m)   Wt 186 lb 6.4 oz (84.6 kg)   SpO2 97%   BMI 34.09 kg/m     Wt Readings from Last 3 Encounters:  05/16/21 186 lb 6.4 oz (84.6 kg)  11/11/20 180 lb (81.6 kg)  05/10/20 175 lb 9.6 oz (79.7 kg)     GEN: Patient is in no acute distress HEENT: Normal NECK: No JVD;  No carotid bruits LYMPHATICS: No lymphadenopathy CARDIAC: Hear sounds regular, 2/6 systolic murmur at the apex. RESPIRATORY:  Clear to auscultation without rales, wheezing or rhonchi  ABDOMEN: Soft, non-tender, non-distended MUSCULOSKELETAL:  No edema; No deformity  SKIN: Warm and dry NEUROLOGIC:  Alert and oriented x 3 PSYCHIATRIC:  Normal affect   Signed, Jenean Lindau, MD  05/16/2021 8:34 AM    [place]

## 2021-05-16 NOTE — Patient Instructions (Signed)
Medication Instructions:  No medication changes. *If you need a refill on your cardiac medications before your next appointment, please call your pharmacy*   Lab Work: Your physician recommends that you have labs done in the office today. Your test included  basic metabolic panel, liver function and lipids.  If you have labs (blood work) drawn today and your tests are completely normal, you will receive your results only by: [place] (if you have MyChart) OR A paper copy in the mail If you have any lab test that is abnormal or we need to change your treatment, we will call you to review the results.   Testing/Procedures: None ordered   Follow-Up: At [facility], you and your health needs are our priority.  As part of our continuing mission to provide you with exceptional heart care, we have created designated Provider Care Teams.  These Care Teams include your primary Cardiologist (physician) and Advanced Practice Providers (APPs -  Physician Assistants and Nurse Practitioners) who all work together to provide you with the care you need, when you need it.  We recommend signing up for the patient portal called "MyChart".  Sign up information is provided on this After Visit Summary.  MyChart is used to connect with patients for Virtual Visits (Telemedicine).  Patients are able to view lab/test results, encounter notes, upcoming appointments, etc.  Non-urgent messages can be sent to your provider as well.   To learn more about what you can do with MyChart, go to [email or web address].    Your next appointment:   6 month(s)  The format for your next appointment:   In Person  Provider:   Jyl Heinz, MD   Other Instructions NA

## 2021-05-18 MED ORDER — FENOFIBRATE 48 MG PO TABS: 48.0000 mg | ORAL_TABLET | Freq: Every day | ORAL | 3 refills | Status: DC

## 2021-05-18 MED ORDER — FENOFIBRATE 48 MG PO TABS: 48.0000 mg | ORAL_TABLET | Freq: Every day | ORAL | 0 refills | Status: DC

## 2021-05-18 NOTE — Addendum Note (Signed)
Addended by: Truddie Hidden on: 05/18/2021 11:59 AM   Modules accepted: Orders

## 2021-05-31 LAB — POTASSIUM: Potassium: 4.1 mmol/L (ref 3.5–5.2)

## 2021-06-25 ENCOUNTER — Other Ambulatory Visit: Payer: Self-pay | Admitting: Cardiology

## 2022-01-17 DIAGNOSIS — L03115 Cellulitis of right lower limb: Secondary | ICD-10-CM | POA: Insufficient documentation

## 2022-01-17 DIAGNOSIS — N904 Leukoplakia of vulva: Secondary | ICD-10-CM

## 2022-01-17 DIAGNOSIS — N950 Postmenopausal bleeding: Secondary | ICD-10-CM | POA: Insufficient documentation

## 2022-01-17 DIAGNOSIS — R9389 Abnormal findings on diagnostic imaging of other specified body structures: Secondary | ICD-10-CM | POA: Insufficient documentation

## 2022-01-17 HISTORY — DX: Cellulitis of right lower limb: L03.115

## 2022-01-17 HISTORY — DX: Leukoplakia of vulva: N90.4

## 2022-01-17 HISTORY — DX: Postmenopausal bleeding: N95.0

## 2022-01-17 HISTORY — DX: Abnormal findings on diagnostic imaging of other specified body structures: R93.89

## 2022-02-20 ENCOUNTER — Other Ambulatory Visit: Payer: Self-pay

## 2022-02-20 ENCOUNTER — Ambulatory Visit: Payer: Medicare HMO | Admitting: Cardiology

## 2022-02-20 VITALS — BP 168/78 | HR 60 | Ht 63.0 in | Wt 182.0 lb

## 2022-02-20 DIAGNOSIS — E669 Obesity, unspecified: Secondary | ICD-10-CM

## 2022-02-20 DIAGNOSIS — I480 Paroxysmal atrial fibrillation: Secondary | ICD-10-CM

## 2022-02-20 DIAGNOSIS — E66811 Obesity, class 1: Secondary | ICD-10-CM

## 2022-02-20 DIAGNOSIS — I10 Essential (primary) hypertension: Secondary | ICD-10-CM | POA: Diagnosis not present

## 2022-02-20 DIAGNOSIS — E782 Mixed hyperlipidemia: Secondary | ICD-10-CM

## 2022-02-20 HISTORY — DX: Obesity, unspecified: E66.9

## 2022-02-20 HISTORY — DX: Obesity, class 1: E66.811

## 2022-02-20 MED ORDER — FISH OIL 1000 MG PO CAPS: 1000.0000 mg | ORAL_CAPSULE | Freq: Two times a day (BID) | ORAL | 3 refills | Status: AC

## 2022-02-20 NOTE — Patient Instructions (Signed)
Medication Instructions:  ?Your physician recommends that you continue on your current medications as directed. Please refer to the Current Medication list given to you today.  ?*If you need a refill on your cardiac medications before your next appointment, please call your pharmacy* ? ? ?Lab Work: ?None ordered ?If you have labs (blood work) drawn today and your tests are completely normal, you will receive your results only by: ?MyChart Message (if you have MyChart) OR ?A paper copy in the mail ?If you have any lab test that is abnormal or we need to change your treatment, we will call you to review the results. ? ? ?Testing/Procedures: ?None Ordered ? ? ?Follow-Up: ?At [facility], you and your health needs are our priority.  As part of our continuing mission to provide you with exceptional heart care, we have created designated Provider Care Teams.  These Care Teams include your primary Cardiologist (physician) and Advanced Practice Providers (APPs -  Physician Assistants and Nurse Practitioners) who all work together to provide you with the care you need, when you need it. ? ?We recommend signing up for the patient portal called "MyChart".  Sign up information is provided on this After Visit Summary.  MyChart is used to connect with patients for Virtual Visits (Telemedicine).  Patients are able to view lab/test results, encounter notes, upcoming appointments, etc.  Non-urgent messages can be sent to your provider as well.   ?To learn more about what you can do with MyChart, go to [email or web address].   ? ?Your next appointment:   ?6 month(s) ? ?The format for your next appointment:   ?In Person ? ?Provider:   ?Jyl Heinz, MD  ? ? ?Other Instructions ?None ? ?

## 2022-02-20 NOTE — Progress Notes (Signed)
?Cardiology Office Note:   ? ?Date:  02/20/2022  ? ?ID:  Jill Strickland, DOB Mar 30, 1941, MRN [id number] ? ?PCP:  Myrlene Broker, MD  ?Cardiologist:  Jenean Lindau, MD  ? ?Referring MD: Myrlene Broker, MD  ? ? ?ASSESSMENT:   ? ?1. Paroxysmal atrial fibrillation (HCC)   ?2. Essential hypertension   ?3. Mixed hyperlipidemia   ?4. Obesity (BMI 30.0-34.9)   ? ?PLAN:   ? ?In order of problems listed above: ? ?Primary prevention stressed with the patient.  Importance of compliance with diet medication stressed and she vocalized understanding.  She was advised to walk at least half an hour a day 5 days a week and she promises to do so. ?Paroxysmal atrial fibrillation:I discussed with the patient atrial fibrillation, disease process. Management and therapy including rate and rhythm control, anticoagulation benefits and potential risks were discussed extensively with the patient. Patient had multiple questions which were answered to patient's satisfaction. ?Essential hypertension: Blood pressure stable and diet was emphasized.  Lifestyle modification urged.  She has not taken her morning medicines this morning.  Her blood pressures are fine at home and she mentioned to me the numbers. ?Mixed dyslipidemia with hypertriglyceridemia: She was advised to take fish oil 2 g p.o. twice daily and prescription was sent.  She will get blood work done by primary care in 2 months and send [place] a copy. ?Obesity: Weight reduction stressed.  Risks of obesity explained and she promises to do better. ?Patient will be seen in follow-up appointment in 6 months or earlier if the patient has any concerns ? ? ? ?Medication Adjustments/Labs and Tests Ordered: ?Current medicines are reviewed at length with the patient today.  Concerns regarding medicines are outlined above.  ?Orders Placed This Encounter  ?Procedures  ? EKG 12-Lead  ? ?Meds ordered this encounter  ?Medications  ? Omega-3 Fatty Acids (FISH OIL) 1000 MG CAPS  ?  Sig: Take 1  capsule (1,000 mg total) by mouth 2 (two) times daily.  ?  Dispense:  180 capsule  ?  Refill:  3  ? ? ? ?No chief complaint on file. ?  ? ?History of Present Illness:   ? ?Jill Strickland is a 81 y.o. female.  Patient has past medical history of paroxysmal atrial fibrillation, essential hypertension, dyslipidemia and obesity.  Overall she leads a sedentary lifestyle.  She denies any chest pain orthopnea or PND.  At the time of my evaluation, the patient is alert awake oriented and in no distress. ? ?Past Medical History:  ?Diagnosis Date  ? 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 Cellulitis of right leg 01/17/2022  ? Last Assessment & Plan:  Formatting of this note might be different from the original. Continue Bactrim per PCP recommendation.  ? CHF (congestive heart failure) ([place]) 02/09/2016  ? Chronic congestive heart failure ([place]) 03/09/2021  ? Edema 02/09/2016  ? Elevated glucose 02/15/2021  ? Essential hypertension 04/29/2015  ? GERD without esophagitis 09/04/2018  ? Hyperlipemia 02/09/2016  ? 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 Last Assessment & Plan:  Formatting of this note might be different from the original. Reviewed diagnosis and recommended treatment included how to apply clobetasol. Discussed risk of vulvar cancer if left untreated. Rx for clobetasol sent to her pharmacy.  Instructions reviewed and patient encouraged to follow-up with Dr. Dayna Ramus in 1 month to assess for improvement. Handout reviewed and given (v  ? Lower leg edema 03/09/2021  ? Mixed dyslipidemia 09/04/2018  ?  Need for immunization against influenza 08/18/2020  ? Paroxysmal atrial fibrillation ([place]) 04/29/2015  ? Last Assessment & Plan:  First documented episode of atrial fibrillation lasting only for a few hours.she ended up going to the hospital. Converted spontaneously. Her chads score is 2. Hypertension and possibly congestive heart failure. She needs to be fully anticoagulated. Had a long discussion with her about  that issue. I spoke with him explaining to her what's atrial fibrillation as was the rea  ? Pedal edema 09/10/2019  ? PMB (postmenopausal bleeding) 01/17/2022  ? Formatting of this note might be different from the original. Added automatically from request for surgery [id number]  Last Assessment & Plan:  Formatting of this note might be different from the original. Benign endometrial polyps s/p resection.  Discussed with patient that given this I would not recommend hysterectomy at this time which she agrees with.  Patient counseled that she should alert Dr.   ? Prediabetes 02/15/2021  ? Last Assessment & Plan:  Formatting of this note might be different from the original. Hgb A1C 11/2021 6%.  ? Screening for colon cancer 03/09/2021  ? Sleep apnea 04/29/2015  ? Overview:  SUSPECTED  ? Stage 3a chronic kidney disease ([place]) 03/09/2021  ? Thickened endometrium 01/17/2022  ? Formatting of this note might be different from the original. Added automatically from request for surgery [id number]  ? Vitamin B12 deficiency 11/05/2019  ? Vitamin D deficiency 02/09/2016  ? ? ?Past Surgical History:  ?Procedure Laterality Date  ? APPENDECTOMY    ? STOMACH SURGERY    ? TUBAL LIGATION    ? ? ?Current Medications: ?Current Meds  ?Medication Sig  ? apixaban (ELIQUIS) 5 MG TABS tablet Take 5 mg by mouth 2 (two) times daily.  ? calcium-vitamin D (OSCAL WITH D) 250-125 MG-UNIT tablet Take 1 tablet by mouth daily.  ? clobetasol ointment (TEMOVATE) 0.99 % Apply 1 application. topically 3 (three) times a week.  ? Cyanocobalamin (B-12 PO) Take 1 tablet by mouth daily.  ? fenofibrate (TRICOR) 48 MG tablet Take 1 tablet (48 mg total) by mouth daily.  ? furosemide (LASIX) 40 MG tablet Take 40 mg by mouth daily.  ? metoprolol tartrate (LOPRESSOR) 50 MG tablet Take 50 mg by mouth 2 (two) times daily.  ? omeprazole (PRILOSEC) 40 MG capsule Take 40 mg by mouth daily.  ? potassium chloride (KLOR-CON M) 10 MEQ tablet Take 10 mEq by mouth daily.  ? rosuvastatin  (CRESTOR) 20 MG tablet Take 20 mg by mouth daily.  ? [DISCONTINUED] Omega-3 Fatty Acids (FISH OIL) 1000 MG CAPS Take 1,000 mg by mouth 2 (two) times daily.  ?  ? ?Allergies:   Morphine, Penicillins, and Cefuroxime axetil  ? ?Social History  ? ?Socioeconomic History  ? Marital status: Married  ?  Spouse name: Not on file  ? Number of children: Not on file  ? Years of education: Not on file  ? Highest education level: Not on file  ?Occupational History  ? Not on file  ?Tobacco Use  ? Smoking status: Never  ? Smokeless tobacco: Never  ?Vaping Use  ? Vaping Use: Never used  ?Substance and Sexual Activity  ? Alcohol use: Never  ? Drug use: Never  ? Sexual activity: Not on file  ?Other Topics Concern  ? Not on file  ?Social History Narrative  ? Not on file  ? ?Social Determinants of Health  ? ?Financial Resource Strain: Not on file  ?Food Insecurity: Not on file  ?  Transportation Needs: Not on file  ?Physical Activity: Not on file  ?Stress: Not on file  ?Social Connections: Not on file  ?  ? ?Family History: ?The patient's family history includes Heart attack in her brother and father; Heart disease in her mother; Hyperlipidemia in her sister; Hypertension in her mother and sister; Osteoporosis in her mother; Thyroid disease in her mother. ? ?ROS:   ?Please see the history of present illness.    ?All other systems reviewed and are negative. ? ?EKGs/Labs/Other Studies Reviewed:   ? ?The following studies were reviewed today: ?EKG reveals sinus rhythm and nonspecific ST-T changes ? ? ?Recent Labs: ?05/16/2021: ALT 18; BUN 13; Creatinine, Ser 0.78; Sodium 143 ?05/31/2021: Potassium 4.1  ?Recent Lipid Panel ?   ?Component Value Date/Time  ? CHOL 149 05/16/2021 0857  ? TRIG 376 (H) 05/16/2021 0857  ? HDL 30 (L) 05/16/2021 0857  ? CHOLHDL 5.0 (H) 05/16/2021 0857  ? [place] 61 05/16/2021 0857  ? ? ?Physical Exam:   ? ?VS:  BP (!) 168/78   Pulse 60   Ht '5\' 3"'$  (1.6 m)   Wt 182 lb (82.6 kg)   SpO2 96%   BMI 32.24 kg/m?    ? ?Wt  Readings from Last 3 Encounters:  ?02/20/22 182 lb (82.6 kg)  ?05/16/21 186 lb 6.4 oz (84.6 kg)  ?11/11/20 180 lb (81.6 kg)  ?  ? ?GEN: Patient is in no acute distress ?HEENT: Normal ?NECK: No JVD; No carotid bruits ?L

## 2022-02-24 ENCOUNTER — Ambulatory Visit: Payer: Medicare HMO | Admitting: Cardiology

## 2022-03-12 ENCOUNTER — Other Ambulatory Visit: Payer: Self-pay | Admitting: Cardiology

## 2022-03-12 DIAGNOSIS — E781 Pure hyperglyceridemia: Secondary | ICD-10-CM

## 2022-03-12 DIAGNOSIS — E782 Mixed hyperlipidemia: Secondary | ICD-10-CM

## 2022-08-23 ENCOUNTER — Encounter: Payer: Self-pay | Admitting: Cardiology

## 2022-08-23 ENCOUNTER — Ambulatory Visit: Payer: Medicare HMO | Attending: Cardiology | Admitting: Cardiology

## 2022-08-23 VITALS — BP 162/70 | HR 64 | Ht 62.0 in | Wt 174.6 lb

## 2022-08-23 DIAGNOSIS — I10 Essential (primary) hypertension: Secondary | ICD-10-CM

## 2022-08-23 DIAGNOSIS — E669 Obesity, unspecified: Secondary | ICD-10-CM | POA: Diagnosis not present

## 2022-08-23 DIAGNOSIS — I480 Paroxysmal atrial fibrillation: Secondary | ICD-10-CM | POA: Diagnosis not present

## 2022-08-23 DIAGNOSIS — E782 Mixed hyperlipidemia: Secondary | ICD-10-CM

## 2022-08-23 NOTE — Patient Instructions (Signed)

## 2022-08-23 NOTE — Progress Notes (Signed)
Cardiology Office Note:    Date:  08/23/2022   ID:  Jill Strickland, DOB 27-Sep-1941, MRN [id number]  PCP:  Myrlene Broker, MD  Cardiologist:  Jenean Lindau, MD   Referring MD: Myrlene Broker, MD    ASSESSMENT:    1. Paroxysmal atrial fibrillation (HCC)   2. Essential hypertension   3. Mixed dyslipidemia   4. Obesity (BMI 30.0-34.9)    PLAN:    In order of problems listed above:  Primary prevention stressed with patient.  Importance of compliance with diet medication stressed and she vocalized understanding. Paroxysmal atrial fibrillation:I discussed with the patient atrial fibrillation, disease process. Management and therapy including rate and rhythm control, anticoagulation benefits and potential risks were discussed extensively with the patient. Patient had multiple questions which were answered to patient's satisfaction. Essential hypertension: Blood pressure stable and diet was emphasized.  She has an element of whitecoat hypertension.  She mentions her blood pressures at home and they are fine. Lipidemia: On lipid-lowering medications followed by primary care.  Lipids were reviewed and discussed with her at length. Obesity: Weight reduction stressed and she promises to do better. Patient will be seen in follow-up appointment in 6 months or earlier if the patient has any concerns    Medication Adjustments/Labs and Tests Ordered: Current medicines are reviewed at length with the patient today.  Concerns regarding medicines are outlined above.  No orders of the defined types were placed in this encounter.  No orders of the defined types were placed in this encounter.    No chief complaint on file.    History of Present Illness:    Jill Strickland is a 81 y.o. female.  Patient has past medical history of paroxysmal atrial fibrillation, essential hypertension, mixed dyslipidemia.  She denies any problems at this time and takes care of activities of daily living.   No chest pain orthopnea or PND.  She ambulates regularly but does not exercise on a regular basis.  At the time of my evaluation, the patient is alert awake oriented and in no distress.  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 Continue Bactrim per PCP recommendation.   CHF (congestive heart failure) ([place]) 02/09/2016   Chronic congestive heart failure ([place]) 03/09/2021   Edema 02/09/2016   Elevated glucose 02/15/2021   Essential hypertension 04/29/2015   GERD without esophagitis 09/04/2018   Hyperlipemia 7/61/6073   Lichen sclerosus of female genitalia 01/17/2022   Last Assessment & Plan:  Formatting of this note might be different from the original. Reviewed diagnosis and recommended treatment included how to apply clobetasol. Discussed risk of vulvar cancer if left untreated. Rx for clobetasol sent to her pharmacy.  Instructions reviewed and patient encouraged to follow-up with Dr. Dayna Ramus in 1 month to assess for improvement. Handout reviewed and given (v   Lower leg edema 03/09/2021   Mixed dyslipidemia 09/04/2018   Need for immunization against influenza 08/18/2020   Obesity (BMI 30.0-34.9) 02/20/2022   Paroxysmal atrial fibrillation ([place]) 04/29/2015   Last Assessment & Plan:  First documented episode of atrial fibrillation lasting only for a few hours.she ended up going to the hospital. Converted spontaneously. Her chads score is 2. Hypertension and possibly congestive heart failure. She needs to be fully anticoagulated. Had a long discussion with her about that issue. I  spoke with him explaining to her what's atrial fibrillation as was the rea   Pedal edema 09/10/2019   PMB (postmenopausal bleeding) 01/17/2022   Formatting of this note might be different from the original. Added automatically from request for surgery [id number]   Last Assessment & Plan:  Formatting of this note might be different from the original. Benign endometrial polyps s/p resection.  Discussed with patient that given this I would not recommend hysterectomy at this time which she agrees with.  Patient counseled that she should alert Dr.    Hayden Pedro 02/15/2021   Last Assessment & Plan:  Formatting of this note might be different from the original. Hgb A1C 11/2021 6%.   Screening for colon cancer 03/09/2021   Sleep apnea 04/29/2015   Overview:  SUSPECTED   Stage 3a chronic kidney disease ([place]) 03/09/2021   Thickened endometrium 01/17/2022   Formatting of this note might be different from the original. Added automatically from request for surgery [id number]   Vitamin B12 deficiency 11/05/2019   Vitamin D deficiency 02/09/2016    Past Surgical History:  Procedure Laterality Date   APPENDECTOMY     STOMACH SURGERY     TUBAL LIGATION      Current Medications: Current Meds  Medication Sig   apixaban (ELIQUIS) 5 MG TABS tablet Take 5 mg by mouth 2 (two) times daily.   Boswellia-Glucosamine-Vit D (OSTEO BI-FLEX ONE PER DAY PO) Take 1 tablet by mouth daily.   calcium-vitamin D (OSCAL WITH D) 250-125 MG-UNIT tablet Take 1 tablet by mouth daily.   Cholecalciferol ([employer]S D3) 25 MCG (1000 UT) capsule Take 1,000 Units by mouth daily.   clobetasol ointment (TEMOVATE) 9.51 % Apply 1 application. topically 3 (three) times a week.   Cyanocobalamin (B-12 PO) Take 1 tablet by mouth daily.   fenofibrate (TRICOR) 48 MG tablet Take 1 tablet (48 mg total) by mouth daily.   furosemide (LASIX) 40 MG tablet Take 40 mg by mouth daily.   METAMUCIL FIBER PO Take 1 Scoop by mouth daily.   metoprolol tartrate (LOPRESSOR) 50 MG tablet Take 50 mg by mouth 2 (two) times daily.   Multiple Vitamins-Minerals (PRESERVISION AREDS PO) Take 1 tablet by mouth daily.   Omega-3 Fatty Acids (FISH OIL) 1000 MG CAPS Take 1 capsule (1,000 mg total) by mouth 2 (two) times daily.   omeprazole  (PRILOSEC) 40 MG capsule Take 40 mg by mouth daily.   potassium chloride (KLOR-CON M) 10 MEQ tablet Take 10 mEq by mouth daily.   rosuvastatin (CRESTOR) 20 MG tablet Take 20 mg by mouth daily.     Allergies:   Morphine, Penicillins, and Cefuroxime axetil   Social History   Socioeconomic History   Marital status: Married    Spouse name: Not on file   Number of children: Not on file   Years of education: Not on file   Highest education level: Not on file  Occupational History   Not on file  Tobacco Use   Smoking status: Never   Smokeless tobacco: Never  Vaping Use   Vaping Use: Never used  Substance and Sexual Activity   Alcohol use: Never   Drug use: Never   Sexual activity: Not on file  Other Topics Concern   Not on file  Social History Narrative   Not on file   Social Determinants of Health   Financial Resource Strain: Not on file  Food Insecurity: Not on file  Transportation Needs: Not on file  Physical Activity: Not  on file  Stress: Not on file  Social Connections: Not on file     Family History: The patient's family history includes Heart attack in her brother and father; Heart disease in her mother; Hyperlipidemia in her sister; Hypertension in her mother and sister; Osteoporosis in her mother; Thyroid disease in her mother.  ROS:   Please see the history of present illness.    All other systems reviewed and are negative.  EKGs/Labs/Other Studies Reviewed:    The following studies were reviewed today: I discussed my findings with the patient at length.   Recent Labs: No results found for requested labs within last 365 days.  Recent Lipid Panel    Component Value Date/Time   CHOL 149 05/16/2021 0857   TRIG 376 (H) 05/16/2021 0857   HDL 30 (L) 05/16/2021 0857   CHOLHDL 5.0 (H) 05/16/2021 0857   LDLCALC 61 05/16/2021 0857    Physical Exam:    VS:  BP (!) 162/70   Pulse 64   Ht '5\' 2"'$  (1.575 m)   Wt 174 lb 9.6 oz (79.2 kg)   SpO2 95%   BMI  31.93 kg/m     Wt Readings from Last 3 Encounters:  08/23/22 174 lb 9.6 oz (79.2 kg)  02/20/22 182 lb (82.6 kg)  05/16/21 186 lb 6.4 oz (84.6 kg)     GEN: Patient is in no acute distress HEENT: Normal NECK: No JVD; No carotid bruits LYMPHATICS: No lymphadenopathy CARDIAC: Hear sounds regular, 2/6 systolic murmur at the apex. RESPIRATORY:  Clear to auscultation without rales, wheezing or rhonchi  ABDOMEN: Soft, non-tender, non-distended MUSCULOSKELETAL:  No edema; No deformity  SKIN: Warm and dry NEUROLOGIC:  Alert and oriented x 3 PSYCHIATRIC:  Normal affect   Signed, Jenean Lindau, MD  08/23/2022 2:37 PM    [place]

## 2022-12-31 ENCOUNTER — Other Ambulatory Visit: Payer: Self-pay | Admitting: Cardiology

## 2022-12-31 DIAGNOSIS — E781 Pure hyperglyceridemia: Secondary | ICD-10-CM

## 2022-12-31 DIAGNOSIS — E782 Mixed hyperlipidemia: Secondary | ICD-10-CM

## 2023-02-14 ENCOUNTER — Telehealth: Payer: Self-pay

## 2023-02-14 NOTE — Telephone Encounter (Signed)
Patient with diagnosis of afib on Eliquis for anticoagulation.    Procedure: colonoscopy Date of procedure: 03/15/23  CHA2DS2-VASc Score = 5  This indicates a 7.2% annual risk of stroke. The patient's score is based upon: CHF History: 1 HTN History: 1 Diabetes History: 0 Stroke History: 0 Vascular Disease History: 0 Age Score: 2 Gender Score: 1   CrCl 66mL/min using adj body weight Platelet count 340K  Per office protocol, patient can hold Eliquis for 2 days prior to procedure as requested.    **This guidance is not considered finalized until pre-operative APP has relayed final recommendations.**

## 2023-02-14 NOTE — Telephone Encounter (Signed)
Patient scheduled for tele visit on 02/28/23. Med rec and consent done

## 2023-02-14 NOTE — Telephone Encounter (Signed)
   Pre-operative Risk Assessment    Patient Name: Jill Strickland  DOB: 1941-11-03 MRN: [id number]      Request for Surgical Clearance    Procedure:   colonoscopy  Date of Surgery:  Clearance 03/15/23                                 Surgeon:  Dr. Christia Reading [place] Surgeon's Group or Practice Name:  [facility]  Phone number:  [phone] Fax number:  [phone]   Type of Clearance Requested:   - Pharmacy:  Hold Apixaban (Eliquis) on 03/13/23   Type of Anesthesia:  Not Indicated   Additional requests/questions:    Gretchen Short   02/14/2023, 8:10 AM

## 2023-02-14 NOTE — Telephone Encounter (Signed)
  Patient Consent for Virtual Visit         Jill Strickland has provided verbal consent on 02/14/2023 for a virtual visit (video or telephone).   CONSENT FOR VIRTUAL VISIT FOR:  Jill Strickland  By participating in this virtual visit I agree to the following:  I hereby voluntarily request, consent and authorize [facility] and its employed or contracted physicians, physician assistants, nurse practitioners or other licensed health care professionals (the Practitioner), to provide me with telemedicine health care services (the "Services") as deemed necessary by the treating Practitioner. I acknowledge and consent to receive the Services by the Practitioner via telemedicine. I understand that the telemedicine visit will involve communicating with the Practitioner through live audiovisual communication technology and the disclosure of certain medical information by electronic transmission. I acknowledge that I have been given the opportunity to request an in-person assessment or other available alternative prior to the telemedicine visit and am voluntarily participating in the telemedicine visit.  I understand that I have the right to withhold or withdraw my consent to the use of telemedicine in the course of my care at any time, without affecting my right to future care or treatment, and that the Practitioner or I may terminate the telemedicine visit at any time. I understand that I have the right to inspect all information obtained and/or recorded in the course of the telemedicine visit and may receive copies of available information for a reasonable fee.  I understand that some of the potential risks of receiving the Services via telemedicine include:  Delay or interruption in medical evaluation due to technological equipment failure or disruption; Information transmitted may not be sufficient (e.g. poor resolution of images) to allow for appropriate medical decision making by the  Practitioner; and/or  In rare instances, security protocols could fail, causing a breach of personal health information.  Furthermore, I acknowledge that it is my responsibility to provide information about my medical history, conditions and care that is complete and accurate to the best of my ability. I acknowledge that Practitioner's advice, recommendations, and/or decision may be based on factors not within their control, such as incomplete or inaccurate data provided by me or distortions of diagnostic images or specimens that may result from electronic transmissions. I understand that the practice of medicine is not an exact science and that Practitioner makes no warranties or guarantees regarding treatment outcomes. I acknowledge that a copy of this consent can be made available to me via my patient portal ([facility]), or I can request a printed copy by calling the office of [facility].    I understand that my insurance will be billed for this visit.   I have read or had this consent read to me. I understand the contents of this consent, which adequately explains the benefits and risks of the Services being provided via telemedicine.  I have been provided ample opportunity to ask questions regarding this consent and the Services and have had my questions answered to my satisfaction. I give my informed consent for the services to be provided through the use of telemedicine in my medical care

## 2023-02-14 NOTE — Telephone Encounter (Signed)
   Name: Jill Strickland  DOB: May 17, 1941  MRN: [id number]  Primary Cardiologist: None   Preoperative team, please contact this patient and set up a phone call appointment for further preoperative risk assessment. Please obtain consent and complete medication review. Thank you for your help.  I confirm that guidance regarding antiplatelet and oral anticoagulation therapy has been completed and, if necessary, noted below. Per pharm D:  Patient with diagnosis of afib on Eliquis for anticoagulation.     Procedure: colonoscopy Date of procedure: 03/15/23   CHA2DS2-VASc Score = 5  This indicates a 7.2% annual risk of stroke. The patient's score is based upon: CHF History: 1 HTN History: 1 Diabetes History: 0 Stroke History: 0 Vascular Disease History: 0 Age Score: 2 Gender Score: 1   CrCl 33mL/min using adj body weight Platelet count 340K   Per office protocol, patient can hold Eliquis for 2 days prior to procedure as requested.     Mayra Reel, NP 02/14/2023, 9:55 AM [place]

## 2023-02-26 NOTE — Progress Notes (Unsigned)
Virtual Visit via Telephone Note   Because of Jill Strickland's co-morbid illnesses, she is at least at moderate risk for complications without adequate follow up.  This format is felt to be most appropriate for this patient at this time.  The patient did not have access to video technology/had technical difficulties with video requiring transitioning to audio format only (telephone).  All issues noted in this document were discussed and addressed.  No physical exam could be performed with this format.  Please refer to the patient's chart for her consent to telehealth for [facility].  Evaluation Performed:  Preoperative cardiovascular risk assessment _____________   Date:  02/26/2023   Patient ID:  Jill Strickland, DOB 01-13-1941, MRN [id number] Patient Location:  Home Provider location:   Office  Primary Care Provider:  Myrlene Broker, MD Primary Cardiologist:  None  Chief Complaint / Patient Profile   82 y.o. y/o female with a h/o CHF, PAF, HTN, OSA, GERD, obesity who is pending colonoscopy and presents today for telephonic preoperative cardiovascular risk assessment.  History of Present Illness    Jill Strickland is a 82 y.o. female who presents via audio/video conferencing for a telehealth visit today.  Pt was last seen in cardiology clinic on 08/23/2022 by Dr. Geraldo Pitter.  At that time Jill Strickland was doing well with no complaints of chest pain or shortness of breath.  She was noted to be not very active and encouraged to increase activity as tolerated.  The patient is now pending procedure as outlined above. Since her last visit, she is doing well with no new cardiac complaints since her previous visit.  She denies chest pain, shortness of breath, lower extremity edema, fatigue, palpitations, melena, hematuria, hemoptysis, diaphoresis, weakness, presyncope, syncope, orthopnea, and PND.   Past Medical History    Past Medical History:  Diagnosis Date   Adult idiopathic  generalized osteoporosis 02/09/2016   Cellulitis of right leg 01/17/2022   Last Assessment & Plan:  Formatting of this note might be different from the original. Continue Bactrim per PCP recommendation.   CHF (congestive heart failure) ([place]) 02/09/2016   Chronic congestive heart failure ([place]) 03/09/2021   Edema 02/09/2016   Elevated glucose 02/15/2021   Essential hypertension 04/29/2015   GERD without esophagitis 09/04/2018   Hyperlipemia [id number]   Lichen sclerosus of female genitalia 01/17/2022   Last Assessment & Plan:  Formatting of this note might be different from the original. Reviewed diagnosis and recommended treatment included how to apply clobetasol. Discussed risk of vulvar cancer if left untreated. Rx for clobetasol sent to her pharmacy.  Instructions reviewed and patient encouraged to follow-up with Dr. Dayna Ramus in 1 month to assess for improvement. Handout reviewed and given (v   Lower leg edema 03/09/2021   Mixed dyslipidemia 09/04/2018   Need for immunization against influenza 08/18/2020   Obesity (BMI 30.0-34.9) 02/20/2022   Paroxysmal atrial fibrillation ([place]) 04/29/2015   Last Assessment & Plan:  First documented episode of atrial fibrillation lasting only for a few hours.she ended up going to the hospital. Converted spontaneously. Her chads score is 2. Hypertension and possibly congestive heart failure. She needs to be fully anticoagulated. Had a long discussion with her about that issue. I spoke with him explaining to her what's atrial fibrillation as was the rea   Pedal edema 09/10/2019   PMB (postmenopausal bleeding) 01/17/2022   Formatting of this note might be different from the original. Added automatically from request for surgery  [id number]  Last Assessment & Plan:  Formatting of this note might be different from the original. Benign endometrial polyps s/p resection.  Discussed with patient that given this I would not recommend hysterectomy at this time which she agrees with.  Patient  counseled that she should alert Dr.    Hayden Pedro 02/15/2021   Last Assessment & Plan:  Formatting of this note might be different from the original. Hgb A1C 11/2021 6%.   Screening for colon cancer 03/09/2021   Sleep apnea 04/29/2015   Overview:  SUSPECTED   Stage 3a chronic kidney disease ([place]) 03/09/2021   Thickened endometrium 01/17/2022   Formatting of this note might be different from the original. Added automatically from request for surgery [id number]   Vitamin B12 deficiency 11/05/2019   Vitamin D deficiency 02/09/2016   Past Surgical History:  Procedure Laterality Date   APPENDECTOMY     STOMACH SURGERY     TUBAL LIGATION      Allergies  Allergies  Allergen Reactions   Morphine Nausea And Vomiting    unknown    Penicillins Rash and Hives    unknown    Cefuroxime Axetil Diarrhea    Home Medications    Prior to Admission medications   Medication Sig Start Date End Date Taking? Authorizing Provider  apixaban (ELIQUIS) 5 MG TABS tablet Take 5 mg by mouth 2 (two) times daily.    [provider]  Boswellia-Glucosamine-Vit D (OSTEO BI-FLEX ONE PER DAY PO) Take 1 tablet by mouth daily.    [provider]  calcium-vitamin D (OSCAL WITH D) 250-125 MG-UNIT tablet Take 1 tablet by mouth daily.    [provider]  Cholecalciferol (CVS D3) 25 MCG (1000 UT) capsule Take 1,000 Units by mouth daily.    [provider]  clobetasol ointment (TEMOVATE) [id number] % Apply 1 application. topically 3 (three) times a week. 02/14/22   [provider]  Cyanocobalamin (B-12 PO) Take 1 tablet by mouth daily.    [provider]  fenofibrate (TRICOR) 48 MG tablet Take 1 tablet (48 mg total) by mouth daily. 01/01/23   Revankar, Reita Cliche, MD  furosemide (LASIX) 40 MG tablet Take 40 mg by mouth daily.    [provider]  METAMUCIL FIBER PO Take 1 Scoop by mouth daily.    [provider]  metoprolol tartrate (LOPRESSOR) 50 MG tablet Take 50 mg  by mouth 2 (two) times daily.    [provider]  Multiple Vitamins-Minerals (PRESERVISION AREDS PO) Take 1 tablet by mouth daily.    [provider]  Omega-3 Fatty Acids (FISH OIL) 1000 MG CAPS Take 1 capsule (1,000 mg total) by mouth 2 (two) times daily. 02/20/22   Revankar, Reita Cliche, MD  omeprazole (PRILOSEC) 40 MG capsule Take 40 mg by mouth daily.    [provider]  potassium chloride (KLOR-CON M) 10 MEQ tablet Take 10 mEq by mouth daily. 09/23/21   [provider]  rosuvastatin (CRESTOR) 20 MG tablet Take 20 mg by mouth daily.    [provider]    Physical Exam    Vital Signs:  Jill Strickland does not have vital signs available for review today.  Given telephonic nature of communication, physical exam is limited. AAOx3. NAD. Normal affect.  Speech and respirations are unlabored.  Accessory Clinical Findings    None  Assessment & Plan    1.  Preoperative Cardiovascular Risk Assessment:  Jill Strickland's perioperative risk of a major cardiac event  is 0.9% according to the Revised Cardiac Risk Index (RCRI).  Therefore, she is at low risk for perioperative complications.   Her functional capacity is fair at 4.31 METs according to the [place] Activity Status Index (DASI). Recommendations: According to ACC/AHA guidelines, no further cardiovascular testing needed.  The patient may proceed to surgery at acceptable risk.   Antiplatelet and/or Anticoagulation Recommendations:  Eliquis (Apixaban) can be held for 2 days prior to surgery.  Please resume post op when felt to be safe.      The patient was advised that if she develops new symptoms prior to surgery to contact our office to arrange for a follow-up visit, and she verbalized understanding.   A copy of this note will be routed to requesting surgeon.  Time:   Today, I have spent 10 minutes with the patient with telehealth technology discussing medical history, symptoms, and management plan.      Mable Fill, Marissa Nestle, NP  02/26/2023, 1:32 PM

## 2023-02-28 ENCOUNTER — Ambulatory Visit: Payer: Medicare HMO | Attending: Cardiology

## 2023-02-28 DIAGNOSIS — Z01810 Encounter for preprocedural cardiovascular examination: Secondary | ICD-10-CM | POA: Diagnosis not present

## 2023-03-22 DIAGNOSIS — R001 Bradycardia, unspecified: Secondary | ICD-10-CM

## 2023-03-22 DIAGNOSIS — I361 Nonrheumatic tricuspid (valve) insufficiency: Secondary | ICD-10-CM

## 2023-05-07 ENCOUNTER — Ambulatory Visit: Payer: Medicare HMO | Admitting: Podiatry

## 2023-05-07 DIAGNOSIS — M79674 Pain in right toe(s): Secondary | ICD-10-CM

## 2023-05-07 DIAGNOSIS — M79675 Pain in left toe(s): Secondary | ICD-10-CM | POA: Diagnosis not present

## 2023-05-07 DIAGNOSIS — L84 Corns and callosities: Secondary | ICD-10-CM

## 2023-05-07 DIAGNOSIS — B351 Tinea unguium: Secondary | ICD-10-CM

## 2023-05-07 MED ORDER — UREA 10 % EX CREA: TOPICAL_CREAM | CUTANEOUS | 0 refills | Status: AC | PRN

## 2023-05-07 NOTE — Progress Notes (Signed)
  Subjective:  Patient ID: Jill Strickland, female    DOB: 1941-01-03,  MRN: [id number]  Chief Complaint  Patient presents with   Callouses    Calluses to bilateral feet. Painful to walk on. Patient is not diabetic.     82 y.o. female presents with the above complaint. History confirmed with patient. Patient presenting with pain related to calluses.  Also concern for inability to trim nails and wants them trimmed at this visit.  He is also concerned about thickness and discoloration of the nails.  Patient does not have a history of T2DM. Patient does have callus present located at the bilateral plantar forefoot causing pain.   Objective:  Physical Exam: warm, good capillary refill nail exam onychomycosis of the toenails, onycholysis, and dystrophic nails DP pulses palpable, PT pulses palpable, and protective sensation intact Left Foot:  Pain with palpation of nails due to elongation and dystrophic growth.  Painful hyperkeratotic lesion subsecond metatarsal head subfifth metatarsal head and subfifth metatarsal base Right Foot: Pain with palpation of nails due to elongation and dystrophic growth.  Subsecond metatarsal head hyperkeratotic lesion as well as fifth metatarsal head  Assessment:   1. Callus of foot   2. Pain due to onychomycosis of toenails of both feet      Plan:  Patient was evaluated and treated and all questions answered.  #Hyperkeratotic lesions/pre ulcerative calluses present bilateral foot 2nd met head, 5th met head and left foot sub 5th met base All symptomatic hyperkeratoses x 5 separate lesions were safely debrided with a sterile #10 blade to patient's level of comfort without incident. We discussed preventative and palliative care of these lesions including supportive and accommodative shoegear, padding, prefabricated and custom molded accommodative orthoses, use of a pumice stone and lotions/creams daily.   #Onychomycosis with pain  -Nails palliatively debrided as  below. -Educated on self-care  Procedure: Nail Debridement Rationale: Pain Type of Debridement: manual, sharp debridement. Instrumentation: Nail nipper, rotary burr. Number of Nails: 10  Return in about 3 months (around 08/07/2023) for RFC.         Corinna Gab, DPM [facility] / [facility]

## 2023-05-27 ENCOUNTER — Other Ambulatory Visit: Payer: Self-pay | Admitting: Cardiology

## 2023-05-27 DIAGNOSIS — E781 Pure hyperglyceridemia: Secondary | ICD-10-CM

## 2023-05-27 DIAGNOSIS — E782 Mixed hyperlipidemia: Secondary | ICD-10-CM

## 2023-08-06 ENCOUNTER — Ambulatory Visit (INDEPENDENT_AMBULATORY_CARE_PROVIDER_SITE_OTHER): Payer: Medicare HMO | Admitting: Podiatry

## 2023-08-06 DIAGNOSIS — L84 Corns and callosities: Secondary | ICD-10-CM | POA: Diagnosis not present

## 2023-08-06 DIAGNOSIS — M79674 Pain in right toe(s): Secondary | ICD-10-CM | POA: Diagnosis not present

## 2023-08-06 DIAGNOSIS — B351 Tinea unguium: Secondary | ICD-10-CM | POA: Diagnosis not present

## 2023-08-06 DIAGNOSIS — M79675 Pain in left toe(s): Secondary | ICD-10-CM | POA: Diagnosis not present

## 2023-08-06 NOTE — Progress Notes (Signed)
  Subjective:  Patient ID: Jill Strickland, female    DOB: [birth year],  MRN: [id number]  Chief Complaint  Patient presents with   Callouses    Callus trim     82 y.o. female presents with the above complaint. History confirmed with patient. Patient presenting with pain related to calluses.  Also concern for inability to trim nails and wants them trimmed at this visit.  He is also concerned about thickness and discoloration of the nails.  Patient does not have a history of T2DM. Patient does have callus present located at the bilateral plantar forefoot causing pain.   Objective:  Physical Exam: warm, good capillary refill nail exam onychomycosis of the toenails, onycholysis, and dystrophic nails DP pulses palpable, PT pulses palpable, and protective sensation intact Left Foot:  Pain with palpation of nails due to elongation and dystrophic growth.  Painful hyperkeratotic lesion subsecond metatarsal head subfifth metatarsal head and subfifth metatarsal base Right Foot: Pain with palpation of nails due to elongation and dystrophic growth.  Subsecond metatarsal head hyperkeratotic lesion as well as fifth metatarsal head  Assessment:   1. Callus of foot   2. Pain due to onychomycosis of toenails of both feet       Plan:  Patient was evaluated and treated and all questions answered.  #Hyperkeratotic lesions/pre ulcerative calluses present bilateral foot 2nd met head, 5th met head and left foot sub 5th met base All symptomatic hyperkeratoses x 5 separate lesions were safely debrided with a sterile #10 blade to patient's level of comfort without incident. We discussed preventative and palliative care of these lesions including supportive and accommodative shoegear, padding, prefabricated and custom molded accommodative orthoses, use of a pumice stone and lotions/creams daily.   #Onychomycosis with pain  -Nails palliatively debrided as below. -Educated on self-care  Procedure: Nail  Debridement Rationale: Pain Type of Debridement: manual, sharp debridement. Instrumentation: Nail nipper, rotary burr. Number of Nails: 10  Return in about 3 months (around 11/05/2023) for RFC.         Corinna Gab, DPM [facility] / [facility]

## 2023-08-09 ENCOUNTER — Other Ambulatory Visit: Payer: Self-pay | Admitting: Cardiology

## 2023-08-09 DIAGNOSIS — E782 Mixed hyperlipidemia: Secondary | ICD-10-CM

## 2023-08-09 DIAGNOSIS — E781 Pure hyperglyceridemia: Secondary | ICD-10-CM

## 2023-10-21 ENCOUNTER — Other Ambulatory Visit: Payer: Self-pay | Admitting: Cardiology

## 2023-10-21 DIAGNOSIS — E781 Pure hyperglyceridemia: Secondary | ICD-10-CM

## 2023-10-21 DIAGNOSIS — E782 Mixed hyperlipidemia: Secondary | ICD-10-CM

## 2023-11-05 ENCOUNTER — Ambulatory Visit: Payer: Medicare HMO | Admitting: Podiatry

## 2023-11-14 ENCOUNTER — Other Ambulatory Visit: Payer: Self-pay | Admitting: Cardiology

## 2023-11-14 DIAGNOSIS — E782 Mixed hyperlipidemia: Secondary | ICD-10-CM

## 2023-11-14 DIAGNOSIS — E781 Pure hyperglyceridemia: Secondary | ICD-10-CM

## 2024-01-15 ENCOUNTER — Ambulatory Visit: Payer: PPO | Attending: Cardiology | Admitting: Cardiology

## 2024-01-15 ENCOUNTER — Encounter: Payer: Self-pay | Admitting: Cardiology

## 2024-01-15 VITALS — BP 144/78 | HR 57 | Wt 172.0 lb

## 2024-01-15 DIAGNOSIS — E66811 Obesity, class 1: Secondary | ICD-10-CM

## 2024-01-15 DIAGNOSIS — G4730 Sleep apnea, unspecified: Secondary | ICD-10-CM | POA: Diagnosis not present

## 2024-01-15 DIAGNOSIS — E782 Mixed hyperlipidemia: Secondary | ICD-10-CM

## 2024-01-15 DIAGNOSIS — I480 Paroxysmal atrial fibrillation: Secondary | ICD-10-CM

## 2024-01-15 DIAGNOSIS — I10 Essential (primary) hypertension: Secondary | ICD-10-CM

## 2024-01-15 NOTE — Progress Notes (Signed)
 Cardiology Office Note:    Date:  01/15/2024   ID:  Jill Strickland, DOB [birth year], MRN [id number]  PCP:  Hadley Pen, MD  Cardiologist:  Garwin Brothers, MD   Referring MD: Hadley Pen, MD    ASSESSMENT:    1. Paroxysmal atrial fibrillation (HCC)   2. Essential hypertension   3. Sleep apnea, unspecified type   4. Mixed dyslipidemia   5. Obesity (BMI 30.0-34.9)    PLAN:    In order of problems listed above:  Primary prevention stressed with the patient.  Importance of compliance with diet medication stressed and patient verbalized standing. Paroxysmal atrial fibrillation:I discussed with the patient atrial fibrillation, disease process. Management and therapy including rate and rhythm control, anticoagulation benefits and potential risks were discussed extensively with the patient. Patient had multiple questions which were answered to patient's satisfaction. Sleep apnea: Sleep health issues were discussed Essential hypertension: Blood pressure stable and diet was emphasized.  Recheck blood pressure was 132/74. Mixed dyslipidemia: Lipids reviewed from her lab work that she brought with her.  I counseled her about this.  Lab work is fine. Obesity: Weight reduction stressed diet emphasized and she promises to do better. Patient will be seen in follow-up appointment in 6 months or earlier if the patient has any concerns.    Medication Adjustments/Labs and Tests Ordered: Current medicines are reviewed at length with the patient today.  Concerns regarding medicines are outlined above.  Orders Placed This Encounter  Procedures   EKG 12-Lead   No orders of the defined types were placed in this encounter.    No chief complaint on file.    History of Present Illness:    Jill Strickland is a 83 y.o. female.  Patient has past medical history of essential hypertension, mixed dyslipidemia, obesity and sleep apnea.  She leads a sedentary lifestyle.  She denies any chest  pain orthopnea or PND.  She has paroxysmal atrial fibrillation and is on Eliquis and takes the medications regularly.  At the time of my evaluation, the patient is alert awake oriented and in no distress.  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 Continue Bactrim per PCP recommendation.   CHF (congestive heart failure) (HCC) 02/09/2016   Chronic congestive heart failure (HCC) 03/09/2021   Edema 02/09/2016   Elevated glucose 02/15/2021   Essential hypertension 04/29/2015   GERD without esophagitis 09/04/2018   Hyperlipemia 02/09/2016   Lichen sclerosus of female genitalia 01/17/2022   Last Assessment & Plan:  Formatting of this note might be different from the original. Reviewed diagnosis and recommended treatment included how to apply clobetasol. Discussed risk of vulvar cancer if left untreated. Rx for clobetasol sent to her pharmacy.  Instructions reviewed and patient encouraged to follow-up with Dr. Romualdo Bolk in 1 month to assess for improvement. Handout reviewed and given (v   Lower leg edema 03/09/2021   Mixed dyslipidemia 09/04/2018   Need for immunization against influenza 08/18/2020   Obesity (BMI 30.0-34.9) 02/20/2022   Paroxysmal atrial fibrillation (HCC) 04/29/2015   Last Assessment & Plan:  First documented episode of atrial fibrillation lasting only for a few hours.she ended up going to the hospital. Converted spontaneously. Her chads score is 2. Hypertension and possibly congestive heart failure. She needs to be fully anticoagulated. Had a long discussion with  her about that issue. I spoke with him explaining to her what's atrial fibrillation as was the rea   Pedal edema 09/10/2019   PMB (postmenopausal bleeding) 01/17/2022   Formatting of this note might be different from the original. Added automatically from request for  surgery [id number]  Last Assessment & Plan:  Formatting of this note might be different from the original. Benign endometrial polyps s/p resection.  Discussed with patient that given this I would not recommend hysterectomy at this time which she agrees with.  Patient counseled that she should alert Dr.    Eveline Strickland 02/15/2021   Last Assessment & Plan:  Formatting of this note might be different from the original. Hgb A1C 11/2021 6%.   Screening for colon cancer 03/09/2021   Sleep apnea 04/29/2015   Overview:  SUSPECTED   Stage 3a chronic kidney disease (HCC) 03/09/2021   Thickened endometrium 01/17/2022   Formatting of this note might be different from the original. Added automatically from request for surgery [id number]   Vitamin B12 deficiency 11/05/2019   Vitamin D deficiency 02/09/2016    Past Surgical History:  Procedure Laterality Date   APPENDECTOMY     STOMACH SURGERY     TUBAL LIGATION      Current Medications: Current Meds  Medication Sig   apixaban (ELIQUIS) 5 MG TABS tablet Take 5 mg by mouth 2 (two) times daily.   Boswellia-Glucosamine-Vit D (OSTEO BI-FLEX ONE PER DAY PO) Take 1 tablet by mouth daily.   calcium-vitamin D (OSCAL WITH D) 250-125 MG-UNIT tablet Take 1 tablet by mouth daily.   Cholecalciferol ([employer] D3) 25 MCG (1000 UT) capsule Take 1,000 Units by mouth daily.   Cyanocobalamin (B-12 PO) Take 1 tablet by mouth daily.   fenofibrate (TRICOR) 48 MG tablet Take 1 tablet (48 mg total) by mouth daily. Patient needs appointment for further refills. 2nd attempt   furosemide (LASIX) 40 MG tablet Take 40 mg by mouth daily.   hydrochlorothiazide (HYDRODIURIL) 12.5 MG tablet Take 12.5 mg by mouth daily.   METAMUCIL FIBER PO Take 1 Scoop by mouth daily.   metoprolol tartrate (LOPRESSOR) 50 MG tablet Take 50 mg by mouth 2 (two) times daily.   Multiple Vitamins-Minerals (PRESERVISION AREDS PO) Take 1 tablet by mouth daily.   olmesartan (BENICAR) 40 MG tablet Take 40 mg by mouth daily.    Omega-3 Fatty Acids (FISH OIL) 1000 MG CAPS Take 1 capsule (1,000 mg total) by mouth 2 (two) times daily.   omeprazole (PRILOSEC) 40 MG capsule Take 40 mg by mouth daily.   potassium chloride (KLOR-CON M) 10 MEQ tablet Take 10 mEq by mouth daily.   rosuvastatin (CRESTOR) 20 MG tablet Take 20 mg by mouth daily.   urea (CARMOL) 10 % cream Apply topically as needed.     Allergies:   Morphine, Penicillins, and Cefuroxime axetil   Social History   Socioeconomic History   Marital status: Married    Spouse name: Not on file   Number of children: Not on file   Years of education: Not on file   Highest education level: Not on file  Occupational History   Not on file  Tobacco Use   Smoking status: Never   Smokeless tobacco: Never  Vaping Use   Vaping status: Never Used  Substance and Sexual Activity   Alcohol use: Never   Drug use: Never   Sexual activity: Not on file  Other Topics Concern   Not on file  Social History Narrative  Not on file   Social Drivers of Health   Financial Resource Strain: Not on file  Food Insecurity: Not on file  Transportation Needs: Not on file  Physical Activity: Not on file  Stress: Not on file  Social Connections: Not on file     Family History: The patient's family history includes Heart attack in her brother and father; Heart disease in her mother; Hyperlipidemia in her sister; Hypertension in her mother and sister; Osteoporosis in her mother; Thyroid disease in her mother.  ROS:   Please see the history of present illness.    All other systems reviewed and are negative.  EKGs/Labs/Other Studies Reviewed:    The following studies were reviewed today: .Marland KitchenEKG Interpretation Date/Time:  Tuesday January 15 2024 08:05:12 EST Ventricular Rate:  57 PR Interval:  152 QRS Duration:  102 QT Interval:  414 QTC Calculation: 402 R Axis:   -40  Text Interpretation: Sinus bradycardia Left axis deviation Left ventricular hypertrophy ( R in aVL ,  Cornell product , Romhilt-Estes ) No previous ECGs available Confirmed by Belva Crome [phone]) on 01/15/2024 8:24:46 AM     Recent Labs: No results found for requested labs within last 365 days.  Recent Lipid Panel    Component Value Date/Time   CHOL 149 05/16/2021 0857   TRIG 376 (H) 05/16/2021 0857   HDL 30 (L) 05/16/2021 0857   CHOLHDL 5.0 (H) 05/16/2021 0857   LDLCALC 61 05/16/2021 0857    Physical Exam:    VS:  BP (!) 144/78   Pulse (!) 57   Wt 172 lb (78 kg)   SpO2 96%   BMI 31.46 kg/m     Wt Readings from Last 3 Encounters:  01/15/24 172 lb (78 kg)  08/23/22 174 lb 9.6 oz (79.2 kg)  02/20/22 182 lb (82.6 kg)     GEN: Patient is in no acute distress HEENT: Normal NECK: No JVD; No carotid bruits LYMPHATICS: No lymphadenopathy CARDIAC: Hear sounds regular, 2/6 systolic murmur at the apex. RESPIRATORY:  Clear to auscultation without rales, wheezing or rhonchi  ABDOMEN: Soft, non-tender, non-distended MUSCULOSKELETAL:  No edema; No deformity  SKIN: Warm and dry NEUROLOGIC:  Alert and oriented x 3 PSYCHIATRIC:  Normal affect   Signed, Garwin Brothers, MD  01/15/2024 8:36 AM    [facility]

## 2024-01-15 NOTE — Patient Instructions (Signed)

## 2024-03-06 ENCOUNTER — Ambulatory Visit: Admitting: Podiatry

## 2024-03-06 DIAGNOSIS — B351 Tinea unguium: Secondary | ICD-10-CM

## 2024-03-06 DIAGNOSIS — M79675 Pain in left toe(s): Secondary | ICD-10-CM | POA: Diagnosis not present

## 2024-03-06 DIAGNOSIS — L84 Corns and callosities: Secondary | ICD-10-CM | POA: Diagnosis not present

## 2024-03-06 DIAGNOSIS — I739 Peripheral vascular disease, unspecified: Secondary | ICD-10-CM | POA: Diagnosis not present

## 2024-03-06 DIAGNOSIS — M79674 Pain in right toe(s): Secondary | ICD-10-CM

## 2024-03-06 NOTE — Progress Notes (Signed)
 Subjective:  Patient ID: Jill Strickland, female    DOB: 07-04-1941,  MRN: [id number]  Jill Strickland presents to clinic today for:  Chief Complaint  Patient presents with   [facility]    RFC today with callous . Not diabetic takes elliquis    Patient notes nails are thick and elongated, causing pain in shoe gear when ambulating.  She also has painful calluses bilateral submet 2 and bilateral submet 5.  She is still taking Eliquis for anticoagulant therapy  PCP is Hadley Pen, MD. date last seen was 01/16/2024  Past Medical History:  Diagnosis Date   Adult idiopathic generalized osteoporosis 02/09/2016   Cellulitis of right leg 01/17/2022   Last Assessment & Plan:  Formatting of this note might be different from the original. Continue Bactrim per PCP recommendation.   CHF (congestive heart failure) (HCC) 02/09/2016   Chronic congestive heart failure (HCC) 03/09/2021   Edema 02/09/2016   Elevated glucose 02/15/2021   Essential hypertension 04/29/2015   GERD without esophagitis 09/04/2018   Hyperlipemia 02/09/2016   Lichen sclerosus of female genitalia 01/17/2022   Last Assessment & Plan:  Formatting of this note might be different from the original. Reviewed diagnosis and recommended treatment included how to apply clobetasol. Discussed risk of vulvar cancer if left untreated. Rx for clobetasol sent to her pharmacy.  Instructions reviewed and patient encouraged to follow-up with Dr. Romualdo Bolk in 1 month to assess for improvement. Handout reviewed and given (v   Lower leg edema 03/09/2021   Mixed dyslipidemia 09/04/2018   Need for immunization against influenza 08/18/2020   Obesity (BMI 30.0-34.9) 02/20/2022   Paroxysmal atrial fibrillation (HCC) 04/29/2015   Last Assessment & Plan:  First documented episode of atrial fibrillation lasting only for a few hours.she ended up going to the hospital. Converted spontaneously. Her chads score is 2. Hypertension and possibly congestive heart failure. She  needs to be fully anticoagulated. Had a long discussion with her about that issue. I spoke with him explaining to her what's atrial fibrillation as was the rea   Pedal edema 09/10/2019   PMB (postmenopausal bleeding) 01/17/2022   Formatting of this note might be different from the original. Added automatically from request for surgery [id number]  Last Assessment & Plan:  Formatting of this note might be different from the original. Benign endometrial polyps s/p resection.  Discussed with patient that given this I would not recommend hysterectomy at this time which she agrees with.  Patient counseled that she should alert Dr.    [name] 02/15/2021   Last Assessment & Plan:  Formatting of this note might be different from the original. Hgb A1C 11/2021 6%.   Screening for colon cancer 03/09/2021   Sleep apnea 04/29/2015   Overview:  SUSPECTED   Stage 3a chronic kidney disease (HCC) 03/09/2021   Thickened endometrium 01/17/2022   Formatting of this note might be different from the original. Added automatically from request for surgery [id number]   Vitamin B12 deficiency 11/05/2019   Vitamin D deficiency 02/09/2016    Allergies  Allergen Reactions   Morphine Nausea And Vomiting    unknown    Penicillins Rash and Hives    unknown    Cefuroxime Axetil Diarrhea    Objective:  MIKIYA NEBERGALL is a pleasant 83 y.o. female in NAD. AAO x 3.  Vascular Examination: Patient has palpable DP pulse, absent PT pulse bilateral.  Delayed capillary refill bilateral toes.  Sparse digital hair  bilateral.  Proximal to distal cooling WNL bilateral.    Dermatological Examination: Interspaces are clear with no open lesions noted bilateral.  Skin is shiny and atrophic bilateral.  Nails are 3-64mm thick, with yellowish/brown discoloration, subungual debris and distal onycholysis x10.  There is pain with compression of nails x10.  There are hyperkeratotic lesions noted bilateral submet 2 and bilateral submet 5.  Patient  qualifies for at-risk foot care because of PVD and long-term anticoagulant therapy.  Assessment/Plan: 1. Pain due to onychomycosis of toenails of both feet   2. Callus of foot   3. PVD (peripheral vascular disease) (HCC)     Mycotic nails x10 were sharply debrided with sterile nail nippers and power debriding burr to decrease bulk and length.  Hyperkeratotic lesions x 4 were shaved with #312 blade.   Return in about 3 months (around 06/05/2024) for RFC.   Clerance Lav, DPM, FACFAS [facility]     [address] [place], [place] [address]                Office [phone]  Fax [phone]

## 2024-05-21 ENCOUNTER — Ambulatory Visit: Admitting: Podiatry

## 2024-05-21 DIAGNOSIS — M79675 Pain in left toe(s): Secondary | ICD-10-CM

## 2024-05-21 DIAGNOSIS — B351 Tinea unguium: Secondary | ICD-10-CM

## 2024-05-21 DIAGNOSIS — M79674 Pain in right toe(s): Secondary | ICD-10-CM | POA: Diagnosis not present

## 2024-05-21 DIAGNOSIS — I739 Peripheral vascular disease, unspecified: Secondary | ICD-10-CM | POA: Diagnosis not present

## 2024-05-21 DIAGNOSIS — L84 Corns and callosities: Secondary | ICD-10-CM

## 2024-05-21 NOTE — Progress Notes (Signed)
 Subjective:  Patient ID: Jill Strickland, female    DOB: 1941-11-04,  MRN: [id number]  Jill Strickland presents to clinic today for:  Chief Complaint  Patient presents with    RFC    Rfc with callous, not diabetic and takes Eliquis.   Patient notes nails are thick and elongated, causing pain in shoe gear when ambulating.  She also has painful calluses bilateral submet 2 and bilateral submet 5.  She is still taking Eliquis for anticoagulant therapy  PCP is Silver Lamar LABOR, MD. date last seen was 01/16/2024  Past Medical History:  Diagnosis Date   Adult idiopathic generalized osteoporosis 02/09/2016   Cellulitis of right leg 01/17/2022   Last Assessment & Plan:  Formatting of this note might be different from the original. Continue Bactrim per PCP recommendation.   CHF (congestive heart failure) (HCC) 02/09/2016   Chronic congestive heart failure (HCC) 03/09/2021   Edema 02/09/2016   Elevated glucose 02/15/2021   Essential hypertension 04/29/2015   GERD without esophagitis 09/04/2018   Hyperlipemia 02/09/2016   Lichen sclerosus of female genitalia 01/17/2022   Last Assessment & Plan:  Formatting of this note might be different from the original. Reviewed diagnosis and recommended treatment included how to apply clobetasol. Discussed risk of vulvar cancer if left untreated. Rx for clobetasol sent to her pharmacy.  Instructions reviewed and patient encouraged to follow-up with Dr. Bevin in 1 month to assess for improvement. Handout reviewed and given (v   Lower leg edema 03/09/2021   Mixed dyslipidemia 09/04/2018   Need for immunization against influenza 08/18/2020   Obesity (BMI 30.0-34.9) 02/20/2022   Paroxysmal atrial fibrillation (HCC) 04/29/2015   Last Assessment & Plan:  First documented episode of atrial fibrillation lasting only for a few hours.she ended up going to the hospital. Converted spontaneously. Her chads score is 2. Hypertension and possibly congestive heart failure. She needs to  be fully anticoagulated. Had a long discussion with her about that issue. I spoke with him explaining to her what's atrial fibrillation as was the rea   Pedal edema 09/10/2019   PMB (postmenopausal bleeding) 01/17/2022   Formatting of this note might be different from the original. Added automatically from request for surgery [id number]  Last Assessment & Plan:  Formatting of this note might be different from the original. Benign endometrial polyps s/p resection.  Discussed with patient that given this I would not recommend hysterectomy at this time which she agrees with.  Patient counseled that she should alert Dr.    Loran 02/15/2021   Last Assessment & Plan:  Formatting of this note might be different from the original. Hgb A1C 11/2021 6%.   Screening for colon cancer 03/09/2021   Sleep apnea 04/29/2015   Overview:  SUSPECTED   Stage 3a chronic kidney disease (HCC) 03/09/2021   Thickened endometrium 01/17/2022   Formatting of this note might be different from the original. Added automatically from request for surgery [id number]   Vitamin B12 deficiency 11/05/2019   Vitamin D deficiency 02/09/2016    Allergies  Allergen Reactions   Morphine Nausea And Vomiting    unknown    Penicillins Rash and Hives    unknown    Cefuroxime Axetil Diarrhea    Objective:  Jill Strickland is a pleasant 83 y.o. female in NAD. AAO x 3.  Vascular Examination: Patient has palpable DP pulse, absent PT pulse bilateral.  Delayed capillary refill bilateral toes.  Sparse digital hair bilateral.  Proximal to distal cooling WNL bilateral.    Dermatological Examination: Interspaces are clear with no open lesions noted bilateral.  Skin is shiny and atrophic bilateral.  Nails are 3-4mm thick, with yellowish/brown discoloration, subungual debris and distal onycholysis x10.  There is pain with compression of nails x10.  There are hyperkeratotic lesions noted bilateral submet 2 and bilateral submet 5.  Patient qualifies for  at-risk foot care because of PVD and long-term anticoagulant therapy.  Assessment/Plan: 1. Pain due to onychomycosis of toenails of both feet   2. Callus of foot   3. PVD (peripheral vascular disease) (HCC)     Mycotic nails x10 were sharply debrided with sterile nail nippers and power debriding burr to decrease bulk and length.  Hyperkeratotic lesions x 4 were shaved with #312 blade.   Return in about 3 months (around 08/21/2024) for RFC.   Awanda CHARM Imperial, DPM, FACFAS [facility]     [address] [place], [place] [address]                Office [phone]  Fax [phone]

## 2024-08-20 ENCOUNTER — Ambulatory Visit: Admitting: Podiatry

## 2024-08-20 DIAGNOSIS — L84 Corns and callosities: Secondary | ICD-10-CM

## 2024-08-20 DIAGNOSIS — B351 Tinea unguium: Secondary | ICD-10-CM | POA: Diagnosis not present

## 2024-08-20 DIAGNOSIS — Z7901 Long term (current) use of anticoagulants: Secondary | ICD-10-CM | POA: Diagnosis not present

## 2024-08-20 DIAGNOSIS — R601 Generalized edema: Secondary | ICD-10-CM | POA: Diagnosis not present

## 2024-08-20 DIAGNOSIS — M79674 Pain in right toe(s): Secondary | ICD-10-CM

## 2024-08-20 DIAGNOSIS — M79675 Pain in left toe(s): Secondary | ICD-10-CM

## 2024-08-20 DIAGNOSIS — I739 Peripheral vascular disease, unspecified: Secondary | ICD-10-CM

## 2024-08-20 NOTE — Progress Notes (Signed)
 Subjective:  Patient ID: Jill Strickland, female    DOB: 1941/02/09,  MRN: [id number]  Jill Strickland presents to clinic today for:  Chief Complaint  Patient presents with   [facility]    Not diabetic, takes Eliquis. Has callus on both feet and needs nail care.    Patient notes nails are thick and elongated, causing pain in shoe gear when ambulating.  Patient states she is not diabetic but her chart reflects that she has already been diagnosed with prediabetes.  She has painful corns/calluses bilateral submet 3, right submet 5, and at the left fifth metatarsal base.  She also has chronic swelling in her lower legs.  PCP is Silver Lamar LABOR, MD. last seen on 06/10/2024  Past Medical History:  Diagnosis Date   Adult idiopathic generalized osteoporosis 02/09/2016   Cellulitis of right leg 01/17/2022   Last Assessment & Plan:  Formatting of this note might be different from the original. Continue Bactrim per PCP recommendation.   CHF (congestive heart failure) (HCC) 02/09/2016   Chronic congestive heart failure (HCC) 03/09/2021   Edema 02/09/2016   Elevated glucose 02/15/2021   Essential hypertension 04/29/2015   GERD without esophagitis 09/04/2018   Hyperlipemia 02/09/2016   Lichen sclerosus of female genitalia 01/17/2022   Last Assessment & Plan:  Formatting of this note might be different from the original. Reviewed diagnosis and recommended treatment included how to apply clobetasol. Discussed risk of vulvar cancer if left untreated. Rx for clobetasol sent to her pharmacy.  Instructions reviewed and patient encouraged to follow-up with Dr. Bevin in 1 month to assess for improvement. Handout reviewed and given (v   Lower leg edema 03/09/2021   Mixed dyslipidemia 09/04/2018   Need for immunization against influenza 08/18/2020   Obesity (BMI 30.0-34.9) 02/20/2022   Paroxysmal atrial fibrillation (HCC) 04/29/2015   Last Assessment & Plan:  First documented episode of atrial fibrillation lasting only for a  few hours.she ended up going to the hospital. Converted spontaneously. Her chads score is 2. Hypertension and possibly congestive heart failure. She needs to be fully anticoagulated. Had a long discussion with her about that issue. I spoke with him explaining to her what's atrial fibrillation as was the rea   Pedal edema 09/10/2019   PMB (postmenopausal bleeding) 01/17/2022   Formatting of this note might be different from the original. Added automatically from request for surgery [id number]  Last Assessment & Plan:  Formatting of this note might be different from the original. Benign endometrial polyps s/p resection.  Discussed with patient that given this I would not recommend hysterectomy at this time which she agrees with.  Patient counseled that she should alert Dr.    Loran 02/15/2021   Last Assessment & Plan:  Formatting of this note might be different from the original. Hgb A1C 11/2021 6%.   Screening for colon cancer 03/09/2021   Sleep apnea 04/29/2015   Overview:  SUSPECTED   Stage 3a chronic kidney disease (HCC) 03/09/2021   Thickened endometrium 01/17/2022   Formatting of this note might be different from the original. Added automatically from request for surgery [id number]   Vitamin B12 deficiency 11/05/2019   Vitamin D deficiency 02/09/2016   Allergies  Allergen Reactions   Morphine Nausea And Vomiting    unknown    Penicillins Rash and Hives    unknown    Cefuroxime Axetil Diarrhea   Objective:  Jill Strickland is a pleasant 83 y.o. female in NAD.  AAO x 3.  Vascular Examination: Patient has palpable DP pulse, absent PT pulse bilateral.  Delayed capillary refill bilateral toes.  Sparse digital hair bilateral.  Proximal to distal cooling WNL bilateral.  +2 pitting edema bilateral lower legs and ankles.  Dermatological Examination: Interspaces are clear with no open lesions noted bilateral.  Skin is shiny and atrophic bilateral.  Nails are 3-47mm thick, with yellowish/brown  discoloration, subungual debris and distal onycholysis x10.  There is pain with compression of nails x10.  There are hyperkeratotic lesions noted bilateral submet 3, right submet 5, and on the plantar aspect of the left fifth metatarsal base.  Patient qualifies for at-risk foot care because of PVD, long-term use of anticoagulants..  Assessment/Plan: 1. Pain due to onychomycosis of toenails of both feet   2. Callus of foot   3. PVD (peripheral vascular disease)   4. Long term current use of anticoagulant     Mycotic nails x10 were sharply debrided with sterile nail nippers and power debriding burr to decrease bulk and length.  Hyperkeratotic lesions x 4 were shaved with #312 blade.  These are all proximal to the toes.  The locations are noted above in the dermatological examination.  Discussed the patient's chronic swelling in her legs.  Referred her to the elastic outlet therapy store and she was given a brochure with the recommended tightness in length to start with to try to help manage the chronic edema.  Return in about 3 months (around 11/19/2024) for RFC.   Awanda CHARM Imperial, DPM, FACFAS [facility]     [address] [place], [place] [address]                Office [phone]  Fax [phone]

## 2024-10-16 ENCOUNTER — Ambulatory Visit: Attending: Cardiology | Admitting: Cardiology

## 2024-10-16 ENCOUNTER — Encounter: Payer: Self-pay | Admitting: Cardiology

## 2024-10-16 VITALS — BP 128/60 | HR 59 | Ht 62.0 in | Wt 157.0 lb

## 2024-10-16 DIAGNOSIS — I480 Paroxysmal atrial fibrillation: Secondary | ICD-10-CM

## 2024-10-16 DIAGNOSIS — I10 Essential (primary) hypertension: Secondary | ICD-10-CM | POA: Diagnosis not present

## 2024-10-16 DIAGNOSIS — G4730 Sleep apnea, unspecified: Secondary | ICD-10-CM

## 2024-10-16 DIAGNOSIS — E782 Mixed hyperlipidemia: Secondary | ICD-10-CM | POA: Diagnosis not present

## 2024-10-16 NOTE — Progress Notes (Signed)
 Cardiology Office Note:    Date:  10/16/2024   ID:  MENDI CONSTABLE, DOB [birth year], MRN [id number]  PCP:  Silver Lamar LABOR, MD  Cardiologist:  Jennifer JONELLE Crape, MD   Referring MD: Silver Lamar LABOR, MD    ASSESSMENT:    1. Essential hypertension   2. Paroxysmal atrial fibrillation (HCC)   3. Mixed dyslipidemia   4. Sleep apnea, unspecified type    PLAN:    In order of problems listed above:  Primary prevention stressed with the patient.  Importance of compliance with diet medication stressed and patient verbalized standing. Paroxysmal atrial fibrillation:I discussed with the patient atrial fibrillation, disease process. Management and therapy including rate and rhythm control, anticoagulation benefits and potential risks were discussed extensively with the patient. Patient had multiple questions which were answered to patient's satisfaction. Essential hypertension: Blood pressure is stable and diet was emphasized. Mixed dyslipidemia: On lipid-lowering medications and diet emphasized. I reviewed blood work from primary done several months ago and discussed with patient at length.  Questions were answered to satisfaction.Patient will be seen in follow-up appointment in 9 months or earlier if the patient has any concerns.    Medication Adjustments/Labs and Tests Ordered: Current medicines are reviewed at length with the patient today.  Concerns regarding medicines are outlined above.  No orders of the defined types were placed in this encounter.  No orders of the defined types were placed in this encounter.    Chief Complaint  Patient presents with   Follow-up     History of Present Illness:    Jill Strickland is a 83 y.o. female.  Patient has past medical history of paroxysmal atrial fibrillation, essential hypertension, mixed dyslipidemia and sleep apnea.  She lost her son few months ago and is handling it well but going through a tough time.  At the time of my  evaluation, the patient is alert awake oriented and in no distress.  She is leading a sedentary lifestyle and does not exercise at this time.  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 Continue Bactrim per PCP recommendation.   CHF (congestive heart failure) (HCC) 02/09/2016   Chronic congestive heart failure (HCC) 03/09/2021   Edema 02/09/2016   Elevated glucose 02/15/2021   Essential hypertension 04/29/2015   GERD without esophagitis 09/04/2018   Hyperlipemia 02/09/2016   Lichen sclerosus of female genitalia 01/17/2022   Last Assessment & Plan:  Formatting of this note might be different from the original. Reviewed diagnosis and recommended treatment included how to apply clobetasol. Discussed risk of vulvar cancer if left untreated. Rx for clobetasol sent to her pharmacy.  Instructions reviewed and patient encouraged to follow-up with Dr. Bevin in 1 month to assess for improvement. Handout reviewed and given (v   Lower leg edema 03/09/2021   Mixed dyslipidemia 09/04/2018   Need for immunization against influenza 08/18/2020   Obesity (BMI 30.0-34.9) 02/20/2022   Paroxysmal atrial fibrillation (HCC) 04/29/2015   Last Assessment & Plan:  First documented episode of atrial fibrillation lasting only for a few hours.she ended up going to the hospital. Converted spontaneously. Her chads score is 2. Hypertension and possibly congestive heart failure. She needs to be fully anticoagulated. Had a long discussion with her about that issue. I spoke with him explaining to her what's atrial fibrillation as  was the rea   Pedal edema 09/10/2019   PMB (postmenopausal bleeding) 01/17/2022   Formatting of this note might be different from the original. Added automatically from request for surgery [id number]  Last Assessment & Plan:  Formatting of this  note might be different from the original. Benign endometrial polyps s/p resection.  Discussed with patient that given this I would not recommend hysterectomy at this time which she agrees with.  Patient counseled that she should alert Dr.    Loran 02/15/2021   Last Assessment & Plan:  Formatting of this note might be different from the original. Hgb A1C 11/2021 6%.   Screening for colon cancer 03/09/2021   Sleep apnea 04/29/2015   Overview:  SUSPECTED   Stage 3a chronic kidney disease (HCC) 03/09/2021   Thickened endometrium 01/17/2022   Formatting of this note might be different from the original. Added automatically from request for surgery [id number]   Vitamin B12 deficiency 11/05/2019   Vitamin D deficiency 02/09/2016    Past Surgical History:  Procedure Laterality Date   APPENDECTOMY     STOMACH SURGERY     TUBAL LIGATION      Current Medications: Current Meds  Medication Sig   apixaban (ELIQUIS) 5 MG TABS tablet Take 5 mg by mouth 2 (two) times daily.   Boswellia-Glucosamine-Vit D (OSTEO BI-FLEX ONE PER DAY PO) Take 1 tablet by mouth daily.   calcium-vitamin D (OSCAL WITH D) 250-125 MG-UNIT tablet Take 1 tablet by mouth daily.   Cholecalciferol ([employer] D3) 25 MCG (1000 UT) capsule Take 1,000 Units by mouth daily.   Cyanocobalamin (B-12 PO) Take 1 tablet by mouth daily.   fenofibrate  (TRICOR ) 48 MG tablet Take 1 tablet (48 mg total) by mouth daily. Patient needs appointment for further refills. 2nd attempt   furosemide  (LASIX ) 40 MG tablet Take 40 mg by mouth daily.   hydrochlorothiazide (HYDRODIURIL) 12.5 MG tablet Take 12.5 mg by mouth daily.   METAMUCIL FIBER PO Take 1 Scoop by mouth daily.   metoprolol tartrate (LOPRESSOR) 50 MG tablet Take 50 mg by mouth 2 (two) times daily.   Multiple Vitamins-Minerals (PRESERVISION AREDS PO) Take 1 tablet by mouth daily.   olmesartan (BENICAR) 40 MG tablet Take 40 mg by mouth daily.   Omega-3 Fatty Acids (FISH OIL ) 1000 MG CAPS Take 1 capsule  (1,000 mg total) by mouth 2 (two) times daily.   omeprazole (PRILOSEC) 40 MG capsule Take 40 mg by mouth daily.   potassium chloride (KLOR-CON M) 10 MEQ tablet Take 10 mEq by mouth daily.   rosuvastatin (CRESTOR) 20 MG tablet Take 20 mg by mouth daily.   urea  (CARMOL) 10 % cream Apply topically as needed.     Allergies:   Morphine, Penicillins, and Cefuroxime axetil   Social History   Socioeconomic History   Marital status: Married    Spouse name: Not on file   Number of children: Not on file   Years of education: Not on file   Highest education level: Not on file  Occupational History   Not on file  Tobacco Use   Smoking status: Never   Smokeless tobacco: Never  Vaping Use   Vaping status: Never Used  Substance and Sexual Activity   Alcohol use: Never   Drug use: Never   Sexual activity: Not on file  Other Topics Concern   Not on file  Social History Narrative   Not on file   Social Drivers of Health   Financial Resource Strain:  Not on file  Food Insecurity: Low Risk  (10/14/2024)   Received from [facility]   Hunger Vital Sign    Within the past 12 months, you worried that your food would run out before you got money to buy more: Never true    Within the past 12 months, the food you bought just didn't last and you didn't have money to get more. : Never true  Transportation Needs: No Transportation Needs (10/14/2024)   Received from [facility]    In the past 12 months, has lack of reliable transportation kept you from medical appointments, meetings, work or from getting things needed for daily living? : No  Physical Activity: Not on file  Stress: Not on file  Social Connections: Not on file     Family History: The patient's family history includes Heart attack in her brother and father; Heart disease in her mother; Hyperlipidemia in her sister; Hypertension in her mother and sister; Osteoporosis in her mother; Thyroid  disease in her  mother.  ROS:   Please see the history of present illness.    All other systems reviewed and are negative.  EKGs/Labs/Other Studies Reviewed:    The following studies were reviewed today: I discussed my findings with the patient at length    Recent Labs: No results found for requested labs within last 365 days.  Recent Lipid Panel    Component Value Date/Time   CHOL 149 05/16/2021 0857   TRIG 376 (H) 05/16/2021 0857   HDL 30 (L) 05/16/2021 0857   CHOLHDL 5.0 (H) 05/16/2021 0857   LDLCALC 61 05/16/2021 0857    Physical Exam:    VS:  BP 128/60   Pulse (!) 59   Ht 5' 2 (1.575 m)   Wt 157 lb (71.2 kg)   SpO2 96%   BMI 28.72 kg/m     Wt Readings from Last 3 Encounters:  10/16/24 157 lb (71.2 kg)  01/15/24 172 lb (78 kg)  08/23/22 174 lb 9.6 oz (79.2 kg)     GEN: Patient is in no acute distress HEENT: Normal NECK: No JVD; No carotid bruits LYMPHATICS: No lymphadenopathy CARDIAC: Hear sounds regular, 2/6 systolic murmur at the apex. RESPIRATORY:  Clear to auscultation without rales, wheezing or rhonchi  ABDOMEN: Soft, non-tender, non-distended MUSCULOSKELETAL:  No edema; No deformity  SKIN: Warm and dry NEUROLOGIC:  Alert and oriented x 3 PSYCHIATRIC:  Normal affect   Signed, Jennifer JONELLE Crape, MD  10/16/2024 9:34 AM    [facility]

## 2024-10-16 NOTE — Patient Instructions (Signed)
Medication Instructions:  Your physician recommends that you continue on your current medications as directed. Please refer to the Current Medication list given to you today.  *If you need a refill on your cardiac medications before your next appointment, please call your pharmacy*   Lab Work: None Ordered If you have labs (blood work) drawn today and your tests are completely normal, you will receive your results only by: MyChart Message (if you have MyChart) OR A paper copy in the mail If you have any lab test that is abnormal or we need to change your treatment, we will call you to review the results.   Testing/Procedures: None Ordered   Follow-Up: At [facility], you and your health needs are our priority.  As part of our continuing mission to provide you with exceptional heart care, we have created designated Provider Care Teams.  These Care Teams include your primary Cardiologist (physician) and Advanced Practice Providers (APPs -  Physician Assistants and Nurse Practitioners) who all work together to provide you with the care you need, when you need it.  We recommend signing up for the patient portal called "MyChart".  Sign up information is provided on this After Visit Summary.  MyChart is used to connect with patients for Virtual Visits (Telemedicine).  Patients are able to view lab/test results, encounter notes, upcoming appointments, etc.  Non-urgent messages can be sent to your provider as well.   To learn more about what you can do with MyChart, go to [email or web address].    Your next appointment:   9 month(s)  The format for your next appointment:   In Person  Provider:   Rajan Revankar, MD    Other Instructions NA  

## 2024-11-26 ENCOUNTER — Ambulatory Visit: Admitting: Podiatry

## 2024-11-26 DIAGNOSIS — M79674 Pain in right toe(s): Secondary | ICD-10-CM

## 2024-11-26 DIAGNOSIS — B351 Tinea unguium: Secondary | ICD-10-CM | POA: Diagnosis not present

## 2024-11-26 DIAGNOSIS — L84 Corns and callosities: Secondary | ICD-10-CM

## 2024-11-26 DIAGNOSIS — Z7901 Long term (current) use of anticoagulants: Secondary | ICD-10-CM | POA: Diagnosis not present

## 2024-11-26 DIAGNOSIS — I739 Peripheral vascular disease, unspecified: Secondary | ICD-10-CM | POA: Diagnosis not present

## 2024-11-26 DIAGNOSIS — M79675 Pain in left toe(s): Secondary | ICD-10-CM

## 2024-11-26 NOTE — Progress Notes (Unsigned)
 Nails x 10.  Bilateral submet 2, bilateral submet 5, and right hallux distal subungual corn.

## 2025-02-25 ENCOUNTER — Ambulatory Visit: Admitting: Podiatry
